# Patient Record
Sex: Female | Born: 1960 | Race: White | Hispanic: No | Marital: Married | Smoking: Former smoker
Health system: Southern US, Community
[De-identification: ages and names within clinical notes are randomized; demographics above are authoritative.]

## PROBLEM LIST (undated history)

## (undated) DIAGNOSIS — M8580 Other specified disorders of bone density and structure, unspecified site: Secondary | ICD-10-CM

## (undated) HISTORY — PX: ABDOMINAL HYSTERECTOMY: SHX81

## (undated) HISTORY — DX: Other specified disorders of bone density and structure, unspecified site: M85.80

---

## 2004-07-11 ENCOUNTER — Emergency Department: Payer: Self-pay | Admitting: Internal Medicine

## 2005-09-23 ENCOUNTER — Emergency Department: Payer: Self-pay | Admitting: Unknown Physician Specialty

## 2005-11-16 IMAGING — CT CT HEAD WITHOUT CONTRAST
2 series · 16 of 30 positions shown, 20 images · non-contrast
Comparison: none

REASON FOR EXAM: headache
COMMENTS:

PROCEDURE:     CT  - CT HEAD WITHOUT CONTRAST  - July 11, 2004  [DATE]
RESULT:     Unenhanced CT of the brain shows no evidence of acute
intracranial hemorrhage, midline shift or mass effect.  No skull fracture or
subgaleal hematoma.

[Series 2: without · axial · non-contrast · 0.42mm/px · z∈[-159,-44]mm · 13 of 27 slices shown, 17 images]
[im 2/27  brain]
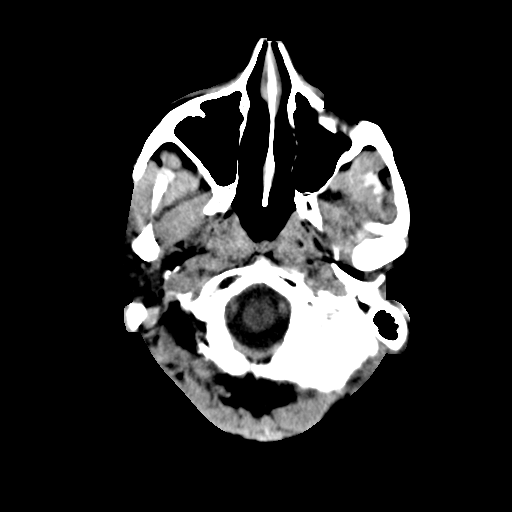
[im 2/27  bone]
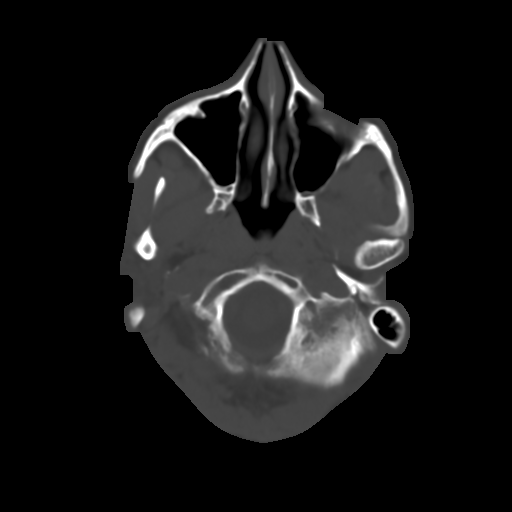
[im 4/27  brain]
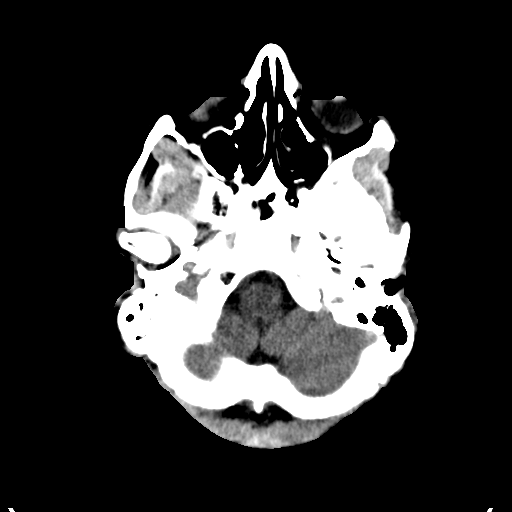
[im 6/27  brain]
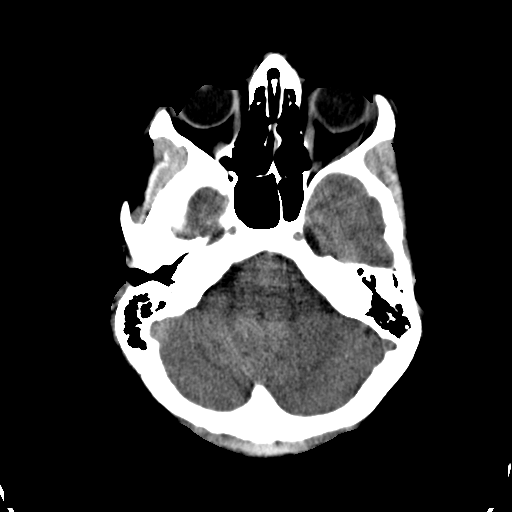
[im 8/27  brain]
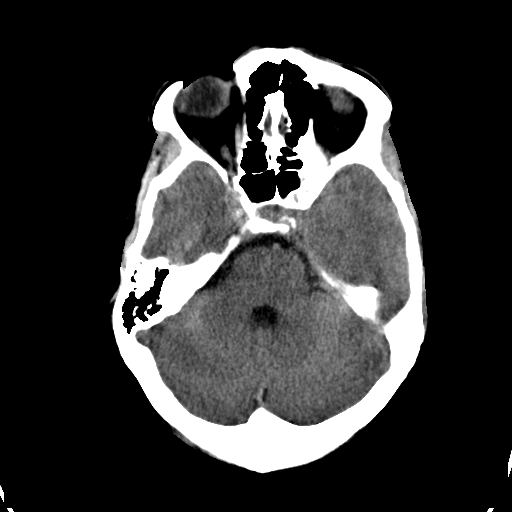
[im 10/27  brain]
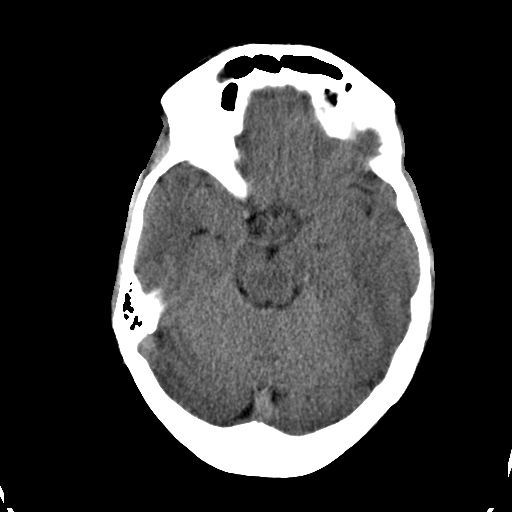
[im 10/27  bone]
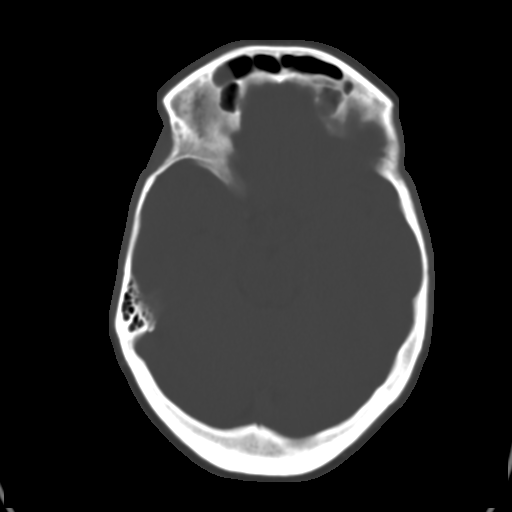
[im 12/27  brain]
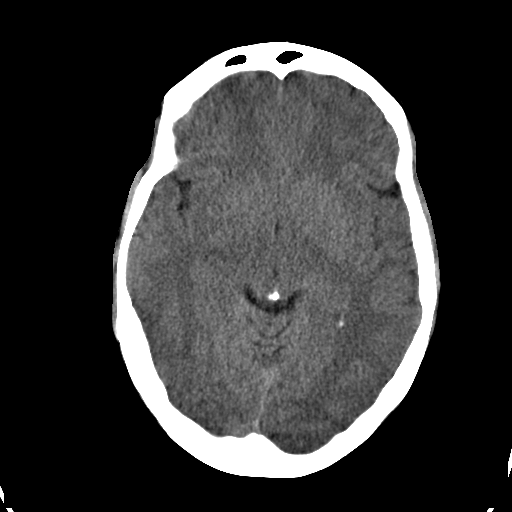
[im 14/27  brain]
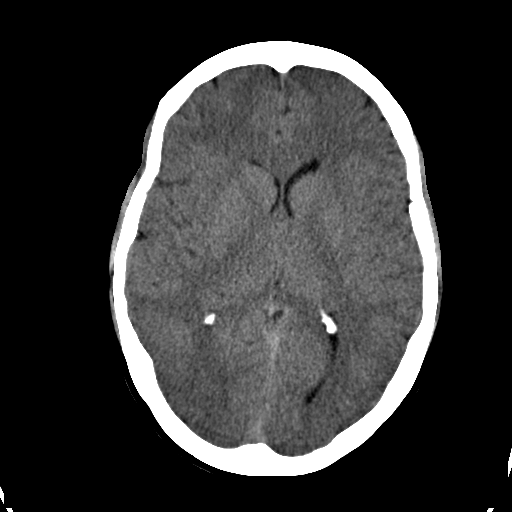
[im 15/27  brain]
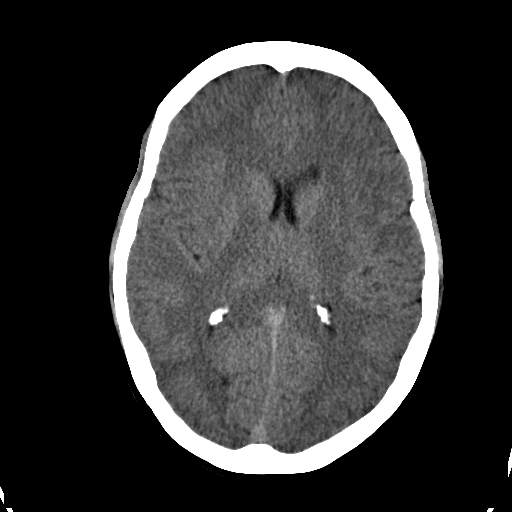
[im 17/27  brain]
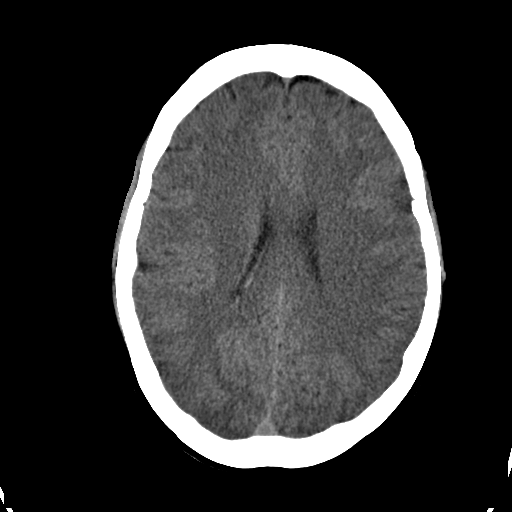
[im 17/27  bone]
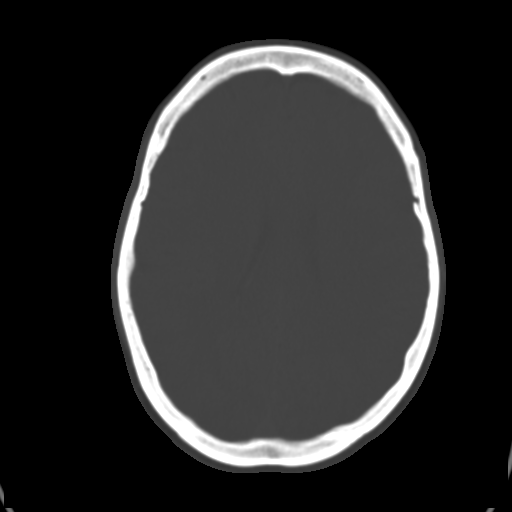
[im 19/27  brain]
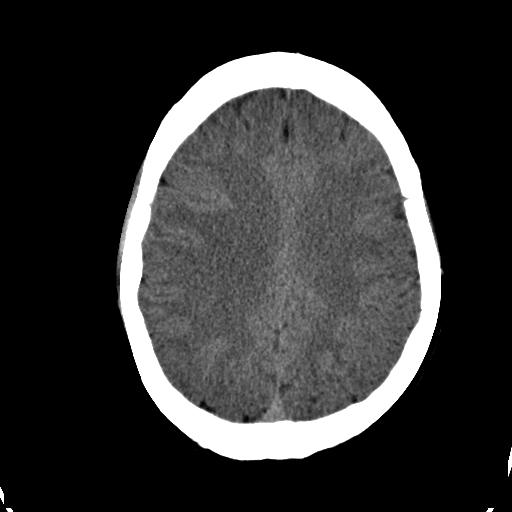
[im 21/27  brain]
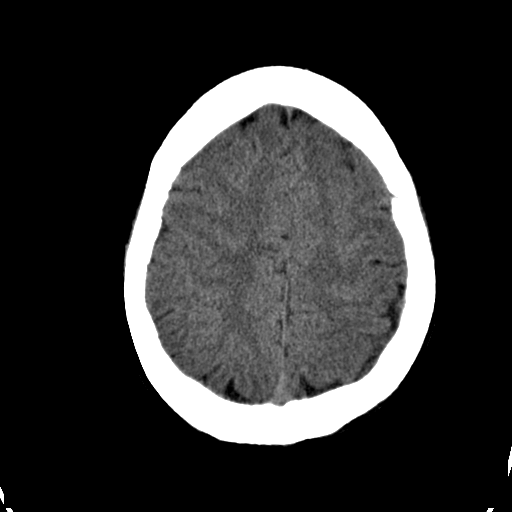
[im 23/27  brain]
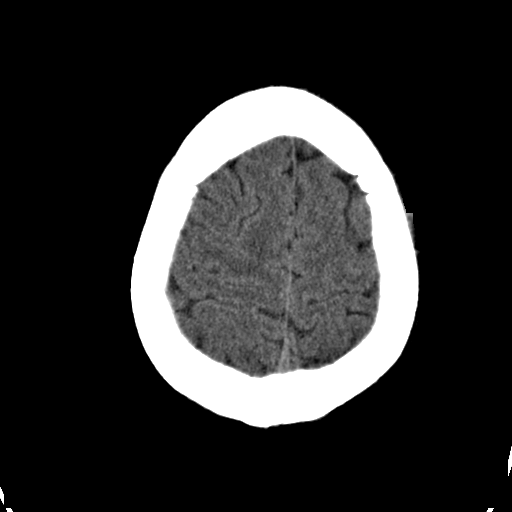
[im 25/27  brain]
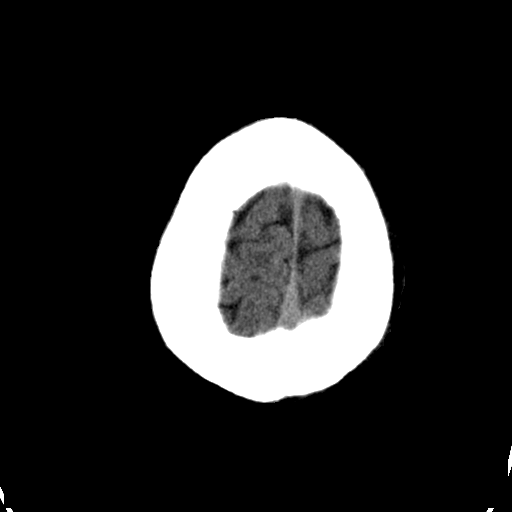
[im 25/27  bone]
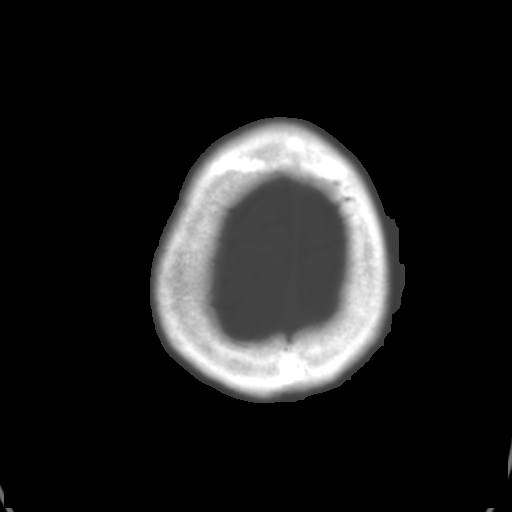

[Series 3: bone windows · axial · 0.42mm/px · z∈[-159,-119]mm · 3 of 27 slices shown]
[im 2/27  bone]
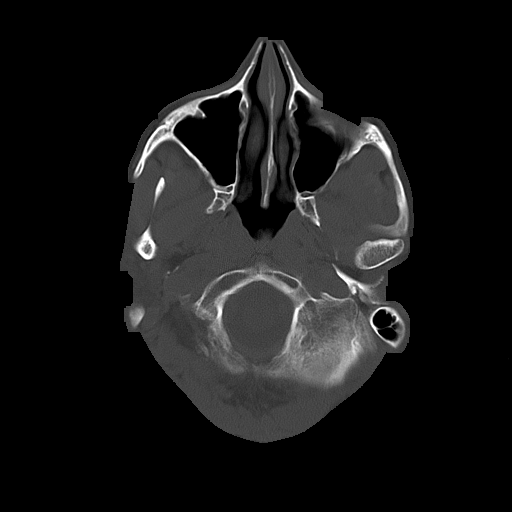
[im 6/27  bone]
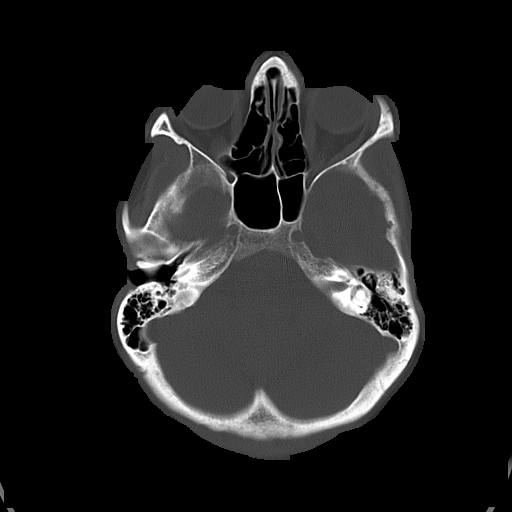
[im 10/27  bone]
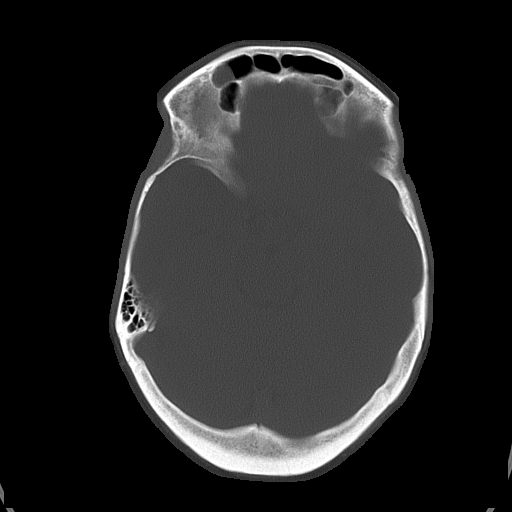

[16 of 30 positions shown; findings below may reference images not displayed]

IMPRESSION: 1.  Normal CT brain.

## 2010-01-04 ENCOUNTER — Emergency Department: Payer: Self-pay | Admitting: Emergency Medicine

## 2011-05-12 IMAGING — US ABDOMEN ULTRASOUND
1 series · 17 of 25 positions shown · non-contrast
Comparison: none

REASON FOR EXAM: ruq pain/ rlq pain
COMMENTS:

PROCEDURE:     US  - US ABDOMEN GENERAL SURVEY  - January 04, 2010  [DATE]
RESULT:     Comparison: None
TECHNIQUE: Multiple gray-scale and color-flow Doppler images of the abdomen
are presented for review.

[Series 1: abdomen ultrasound · 17 of 57 slices shown]
[im 1/57]
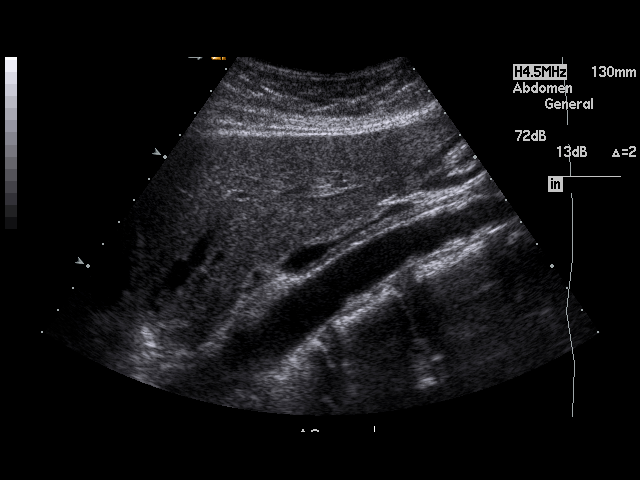
[im 5/57]
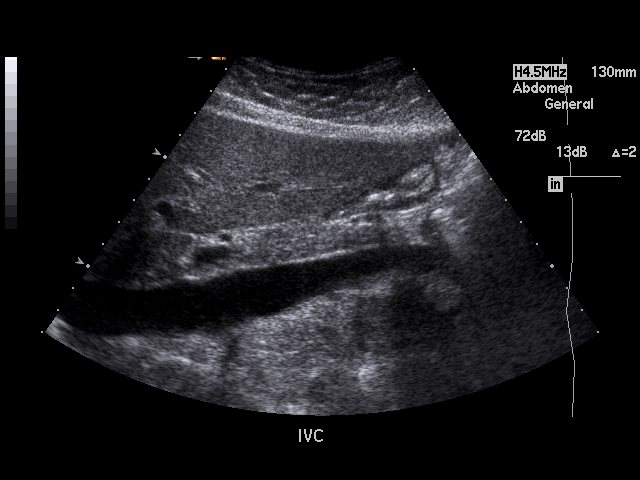
[im 8/57]
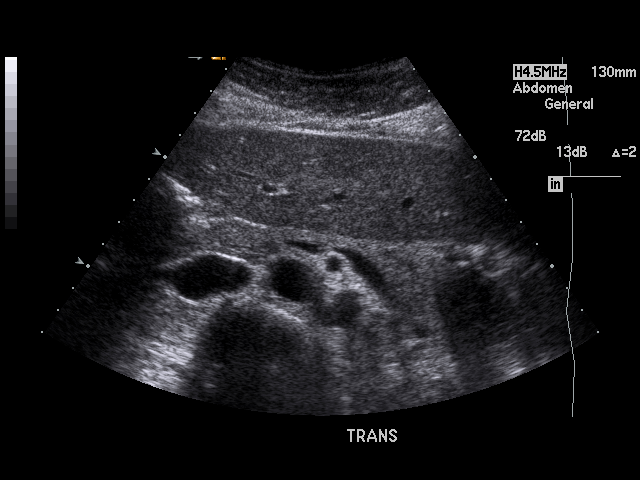
[im 12/57]
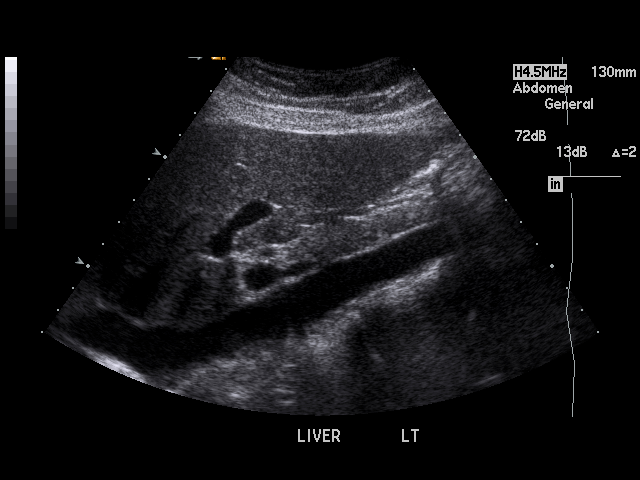
[im 15/57]
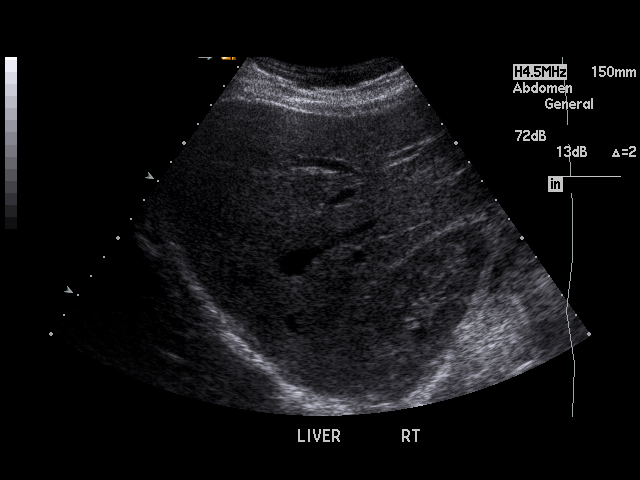
[im 19/57]
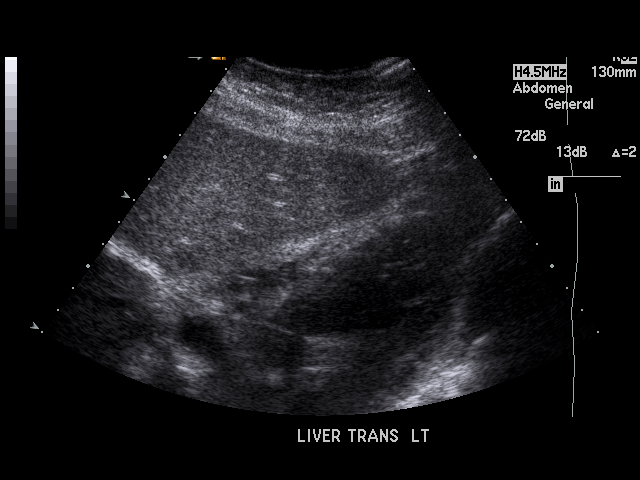
[im 22/57]
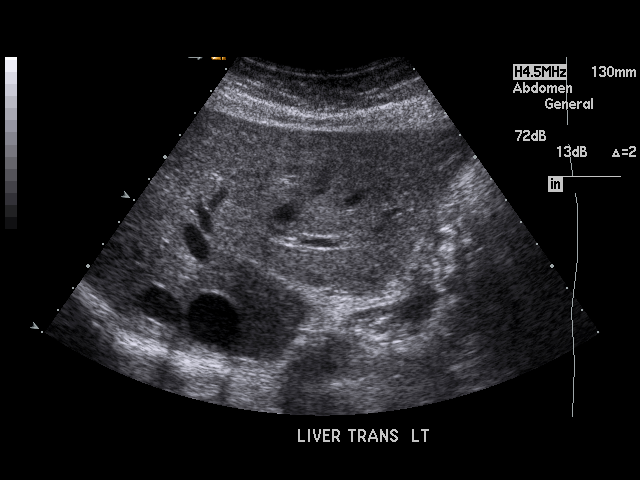
[im 26/57]
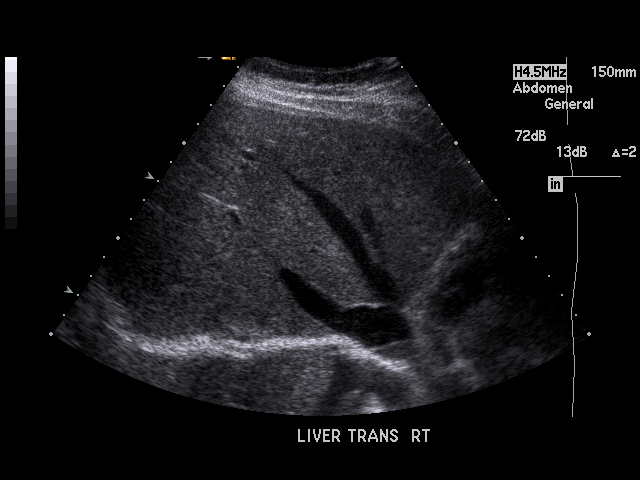
[im 29/57]
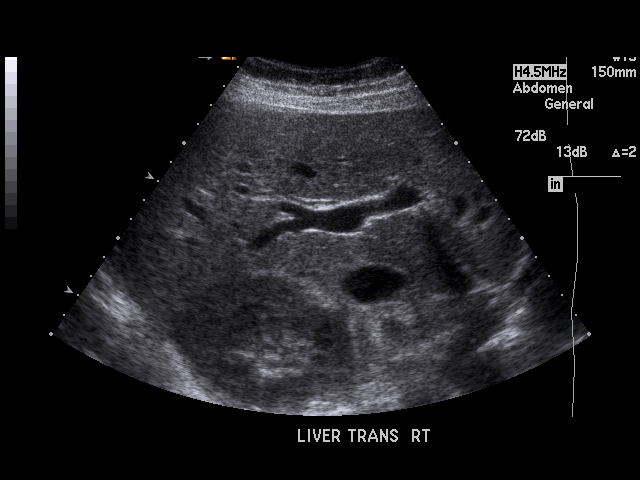
[im 31/57]
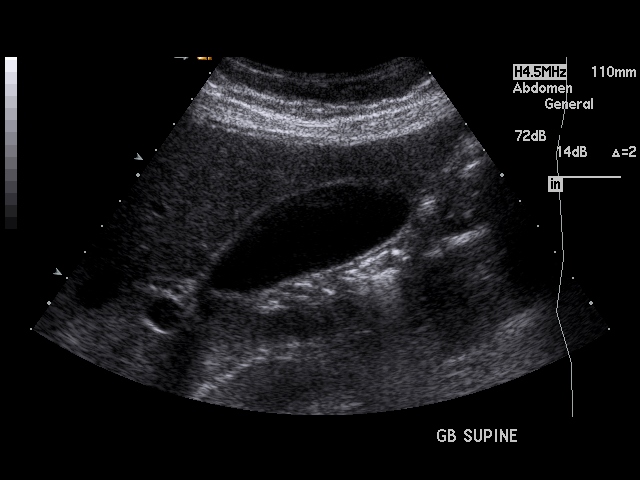
[im 36/57]
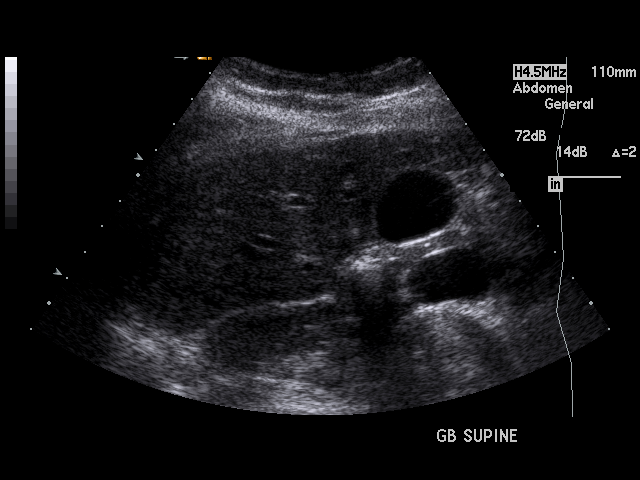
[im 38/57]
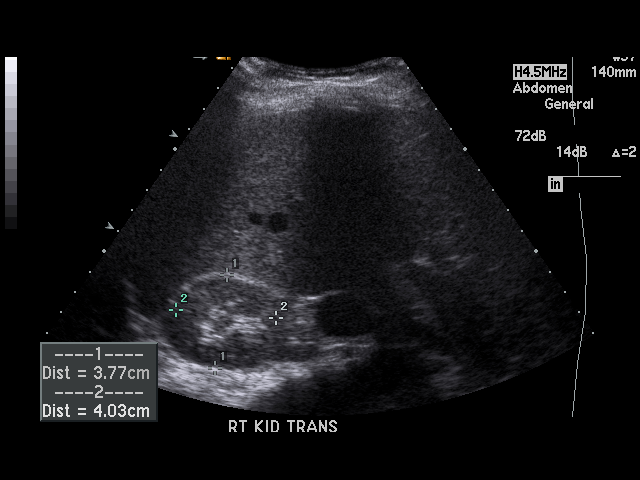
[im 43/57]
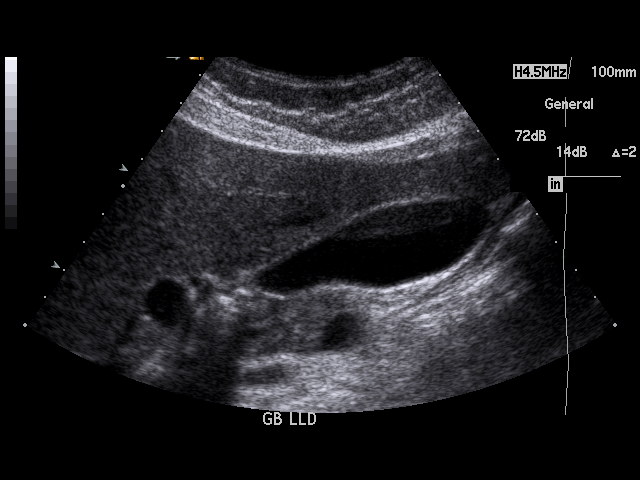
[im 45/57]
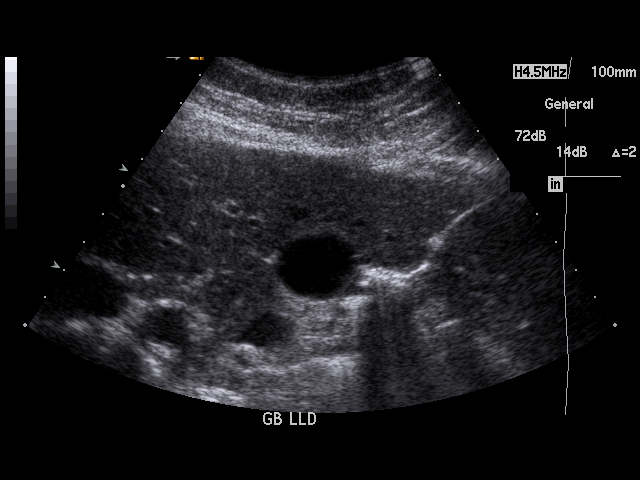
[im 50/57]
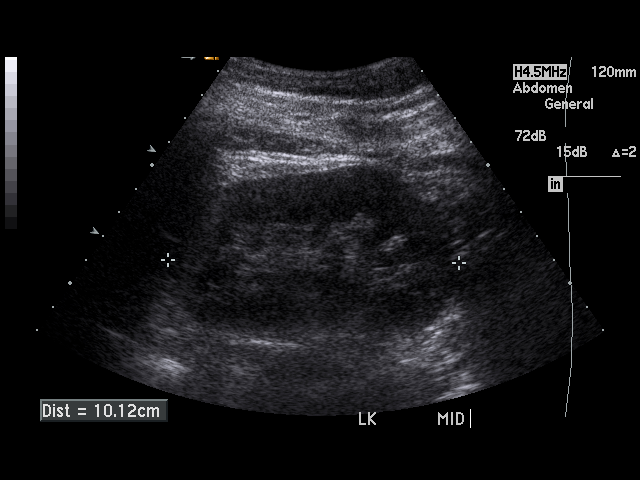
[im 52/57]
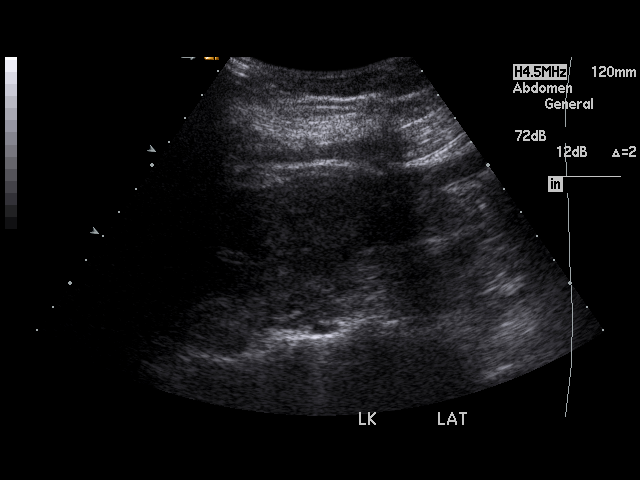
[im 57/57]
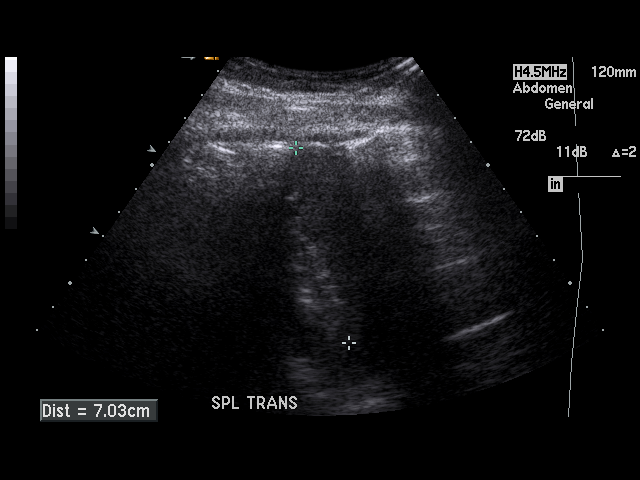

[17 of 25 positions shown; findings below may reference images not displayed]

FINDINGS: Visualized portions of the liver demonstrate normal echogenicity and normal
contours. The liver is without evidence of a focal hepatic lesion.

There is no cholelithiasis or biliary sludge. There is no intra- or
extrahepatic biliary ductal dilatation. The common duct measures 2.5 mm in
maximal diameter. There is no gallbladder wall thickening, pericholecystic
fluid, or sonographic Murphy's sign.

The visualized portion of the pancreas is normal in echogenicity. The spleen
is unremarkable. Bilateral kidneys are normal in echogenicity and size. The
right kidney measures 9.4 x 3.8 x 4 cm. The left kidney measures 10.1 x
x 4.7 cm. There are no renal calculi or hydronephrosis. The abdominal aorta
and IVC are unremarkable.
IMPRESSION: Normal abdominal ultrasound.

## 2014-07-01 ENCOUNTER — Emergency Department: Payer: Self-pay | Admitting: Emergency Medicine

## 2020-08-22 ENCOUNTER — Ambulatory Visit: Payer: Self-pay | Admitting: Family Medicine

## 2020-08-22 ENCOUNTER — Other Ambulatory Visit: Payer: Self-pay

## 2020-08-22 ENCOUNTER — Encounter: Payer: Self-pay | Admitting: Family Medicine

## 2020-08-22 VITALS — BP 127/62 | HR 72 | Temp 99.1°F | Resp 18 | Ht 63.5 in | Wt 116.4 lb

## 2020-08-22 DIAGNOSIS — Z889 Allergy status to unspecified drugs, medicaments and biological substances status: Secondary | ICD-10-CM

## 2020-08-22 DIAGNOSIS — Z8739 Personal history of other diseases of the musculoskeletal system and connective tissue: Secondary | ICD-10-CM

## 2020-08-22 DIAGNOSIS — Z8719 Personal history of other diseases of the digestive system: Secondary | ICD-10-CM

## 2020-08-22 DIAGNOSIS — Z7689 Persons encountering health services in other specified circumstances: Secondary | ICD-10-CM

## 2020-08-22 DIAGNOSIS — J329 Chronic sinusitis, unspecified: Secondary | ICD-10-CM

## 2020-08-22 MED ORDER — AMOXICILLIN-POT CLAVULANATE 875-125 MG PO TABS: 1.0000 | ORAL_TABLET | Freq: Two times a day (BID) | ORAL | 0 refills | Status: DC

## 2020-08-22 NOTE — Assessment & Plan Note (Signed)
Acute sinusitis with green purulent drainage reported from nose.  Will treat with augmentin 875-125mg  BID x 10 days.  Encouraged to restart claritin and mucinex, as she has taken these seasonally for allergies.  Offered and declined COVID testing in clinic today.  Plan: 1. Start augmentin 875-125mg  BID x 10 days 2. Restart claritin and mucinex, according to packaging directions 3. RTC PRN

## 2020-08-22 NOTE — Patient Instructions (Signed)
I have sent in a prescription for Augmentin 875-125mg  to take 1 tablet 2 x per day for the next 10 days.  Can restart on your claritin and mucinex to help with the secretions  We will plan to see you back in 6 months for your physical and labs  You will receive a survey after today's visit either digitally by e-mail or paper by [employer]. Your experiences and feedback matter to [place].  Please respond so we know how we are doing as we provide care for you.  Call us with any questions/concerns/needs.  It is my goal to be available to you for your health concerns.  Thanks for choosing me to be a partner in your healthcare needs!  Charlaine Dalton, FNP-C Family Nurse Practitioner [facility] Phone: [phone]

## 2020-08-22 NOTE — Assessment & Plan Note (Signed)
New patient establishment at [facility] for primary care.  Previously followed with [facility], copies of medical records to be requested.

## 2020-08-22 NOTE — Assessment & Plan Note (Signed)
History of seasonal allergies that are typically well managed with claritin and mucinex.

## 2020-08-22 NOTE — Assessment & Plan Note (Signed)
Manages with dietary modifications.  Reports in the past she had been on prilosec with good relief of symptoms.

## 2020-08-22 NOTE — Assessment & Plan Note (Signed)
Reports history of osteopenia, has been taking centrum silver with a supplementation of calcium through dietary sources.  Unknown last DEXA.  Awaiting previous medical records.

## 2020-08-22 NOTE — Progress Notes (Signed)
Subjective:    Patient ID: Gina Gill, female    DOB: 08-27-1961, 59 y.o.   MRN: [id number]  Gina Gill is a 59 y.o. female presenting on 08/22/2020 for Establish Care (Facial pressure, post nasal drainage x 4 days )   HPI   Gina Gill presents to clinic as a new patient to establish care for primary care services.  Previous PCP was at [facility].  Records will be requested.  Past medical, family, and surgical history reviewed w/ pt.  She has acute concerns for sinus pain/pressure, with green drainage and post nasal drainage x 4 days.  Denies known sick contacts, COVID testing, or vaccination for COVID.  Depression screen PHQ 2/9 08/22/2020  Decreased Interest 0  Down, Depressed, Hopeless 0  PHQ - 2 Score 0    Social History   Tobacco Use  . Smoking status: Current Every Day Smoker    Packs/day: 0.50    Years: 30.00    Pack years: 15.00  . Smokeless tobacco: Never Used  Vaping Use  . Vaping Use: Never used  Substance Use Topics  . Alcohol use: Not Currently  . Drug use: Never    Review of Systems  Constitutional: Negative.   HENT: Positive for congestion, postnasal drip, rhinorrhea, sinus pressure and sinus pain. Negative for dental problem, drooling, ear discharge, ear pain, facial swelling, hearing loss, mouth sores, nosebleeds, sneezing, sore throat, tinnitus, trouble swallowing and voice change.   Eyes: Negative.   Respiratory: Negative.   Cardiovascular: Negative.   Gastrointestinal: Negative.   Endocrine: Negative.   Genitourinary: Negative.   Musculoskeletal: Negative.   Skin: Negative.   Allergic/Immunologic: Negative.   Neurological: Negative.   Hematological: Negative.   Psychiatric/Behavioral: Negative.    Per HPI unless specifically indicated above     Objective:    BP 127/62 (BP Location: Right Arm, Patient Position: Sitting, Cuff Size: Normal)   Pulse 72   Temp 99.1 F (37.3 C) (Oral)   Resp 18   Ht 5' 3.5" (1.613 m)    Wt 116 lb 6.4 oz (52.8 kg)   SpO2 98%   BMI 20.30 kg/m   Wt Readings from Last 3 Encounters:  08/22/20 116 lb 6.4 oz (52.8 kg)    Physical Exam Vitals and nursing note reviewed.  Constitutional:      General: She is not in acute distress.    Appearance: Normal appearance. She is well-developed, well-groomed and normal weight. She is not ill-appearing or toxic-appearing.  HENT:     Head: Normocephalic and atraumatic.     Nose: Congestion and rhinorrhea present. Rhinorrhea is purulent.     Right Turbinates: Swollen. Not enlarged or pale.     Left Turbinates: Swollen. Not enlarged or pale.     Right Sinus: Maxillary sinus tenderness and frontal sinus tenderness present.     Left Sinus: Maxillary sinus tenderness and frontal sinus tenderness present.     Comments: Gina Gill is in place, covering mouth and nose. Eyes:     General: Lids are normal. Vision grossly intact.        Right eye: No discharge.        Left eye: No discharge.     Extraocular Movements: Extraocular movements intact.     Conjunctiva/sclera: Conjunctivae normal.     Pupils: Pupils are equal, round, and reactive to light.  Cardiovascular:     Rate and Rhythm: Normal rate and regular rhythm.     Pulses: Normal pulses.  Heart sounds: Normal heart sounds. No murmur heard. No friction rub. No gallop.   Pulmonary:     Effort: Pulmonary effort is normal. No respiratory distress.     Breath sounds: Normal breath sounds.  Skin:    General: Skin is warm and dry.     Capillary Refill: Capillary refill takes less than 2 seconds.  Neurological:     General: No focal deficit present.     Mental Status: She is alert and oriented to person, place, and time.  Psychiatric:        Attention and Perception: Attention and perception normal.        Mood and Affect: Mood and affect normal.        Speech: Speech normal.        Behavior: Behavior normal. Behavior is cooperative.        Thought Content: Thought content normal.         Cognition and Memory: Cognition and memory normal.        Judgment: Judgment normal.    No results found for this or any previous visit.    Assessment & Plan:   Problem List Items Addressed This Visit      Respiratory   Sinusitis    Acute sinusitis with green purulent drainage reported from nose.  Will treat with augmentin 875-125mg  BID x 10 days.  Encouraged to restart claritin and mucinex, as she has taken these seasonally for allergies.  Offered and declined COVID testing in clinic today.  Plan: 1. Start augmentin 875-125mg  BID x 10 days 2. Restart claritin and mucinex, according to packaging directions 3. RTC PRN      Relevant Medications   amoxicillin-clavulanate (AUGMENTIN) 875-125 MG tablet     Other   Encounter to establish care with new doctor - Primary    New patient establishment at [facility] for primary care.  Previously followed with [facility], copies of medical records to be requested.        History of gastroesophageal reflux (GERD)    Manages with dietary modifications.  Reports in the past she had been on prilosec with good relief of symptoms.      History of seasonal allergies    History of seasonal allergies that are typically well managed with claritin and mucinex.      History of osteopenia    Reports history of osteopenia, has been taking centrum silver with a supplementation of calcium through dietary sources.  Unknown last DEXA.  Awaiting previous medical records.         Meds ordered this encounter  Medications  . amoxicillin-clavulanate (AUGMENTIN) 875-125 MG tablet    Sig: Take 1 tablet by mouth 2 (two) times daily.    Dispense:  20 tablet    Refill:  0   Follow up plan: Return in about 6 months (around 02/20/2021) for CPE.   Charlaine Dalton, FNP Family Nurse Practitioner [facility] 08/22/2020, 11:28 AM

## 2021-06-11 ENCOUNTER — Encounter: Payer: Self-pay | Admitting: Internal Medicine

## 2021-06-11 ENCOUNTER — Other Ambulatory Visit: Payer: Self-pay

## 2021-06-11 ENCOUNTER — Ambulatory Visit: Payer: Self-pay | Admitting: Internal Medicine

## 2021-06-11 VITALS — BP 125/66 | HR 98 | Temp 97.8°F | Resp 18 | Ht 63.5 in | Wt 108.2 lb

## 2021-06-11 DIAGNOSIS — J329 Chronic sinusitis, unspecified: Secondary | ICD-10-CM

## 2021-06-11 DIAGNOSIS — B9789 Other viral agents as the cause of diseases classified elsewhere: Secondary | ICD-10-CM

## 2021-06-11 MED ORDER — AMOXICILLIN-POT CLAVULANATE 875-125 MG PO TABS: 1.0000 | ORAL_TABLET | Freq: Two times a day (BID) | ORAL | 0 refills | Status: DC

## 2021-06-11 NOTE — Progress Notes (Signed)
HPI  Pt reports facial pressure, watery eyes, runny nose, post nasal drip and cough. She reports this started 4-5 days ago. She is blowing clear mucous out of her nose. The cough is dry and nonproductive. She denies headache, eye pain, eye redness, discharge, ear pain, sore throat or shortness of breath. She denies fever, chills or body aches. She has tried Mucinex and Theraflu with minimal relief of symptoms. She has had a negative covid test at home.  Review of Systems     Past Medical History:  Diagnosis Date   Osteopenia     Family History  Problem Relation Age of Onset   Brain cancer Mother    Stroke Father    Heart disease Sister    Diabetes Sister    Heart disease Brother     Social History   Socioeconomic History   Marital status: Married    Spouse name: Not on file   Number of children: Not on file   Years of education: Not on file   Highest education level: Not on file  Occupational History   Not on file  Tobacco Use   Smoking status: Every Day    Packs/day: 0.50    Years: 30.00    Pack years: 15.00    Types: Cigarettes   Smokeless tobacco: Never  Vaping Use   Vaping Use: Never used  Substance and Sexual Activity   Alcohol use: Not Currently   Drug use: Never   Sexual activity: Not on file  Other Topics Concern   Not on file  Social History Narrative   Not on file   Social Determinants of Health   Financial Resource Strain: Not on file  Food Insecurity: Not on file  Transportation Needs: Not on file  Physical Activity: Not on file  Stress: Not on file  Social Connections: Not on file  Intimate Partner Violence: Not on file    No Known Allergies   Constitutional: Denies headache, fatigue, fever or abrupt weight changes.  HEENT:  Positive facial pressure, nasal congestion and post nasal drip. Denies eye redness, ear pain, ringing in the ears, wax buildup, nasal congestion or sore throat. Respiratory: Positive cough. Denies difficulty breathing  or shortness of breath.  Cardiovascular: Denies chest pain, chest tightness, palpitations or swelling in the hands or feet.   No other specific complaints in a complete review of systems (except as listed in HPI above).  Objective:  BP 125/66 (BP Location: Right Arm, Patient Position: Sitting, Cuff Size: Small)   Pulse 98   Temp 97.8 F (36.6 C) (Temporal)   Resp 18   Ht 5' 3.5" (1.613 m)   Wt 108 lb 3.2 oz (49.1 kg)   SpO2 98%   BMI 18.87 kg/m    General: Appears her stated age, well developed, well nourished in NAD. HEENT: Head: normal shape and size, maxillary sinus tenderness noted;  Nose: mucosa boggy and moist, septum midline; Throat/Mouth: + PND. Teeth present, mucosa erythematous and moist, no exudate noted, no lesions or ulcerations noted.  Neck:  No adenopathy noted.  Cardiovascular: Normal rate and rhythm. S1,S2 noted.  No murmur, rubs or gallops noted.  Pulmonary/Chest: Normal effort and positive vesicular breath sounds. No respiratory distress. No wheezes, rales or ronchi noted.       Assessment & Plan:  Viral Sinusitis  Can use a Neti Pot which can be purchased from your local drug store. Flonase 2 sprays each nostril for 3 days and then as needed. Start Xyzal  OTC She is insistent on abx although I think Prednisone would be more beneficial, eRx for Augmentin BID for 10 days  RTC as needed or if symptoms persist. Nicki Reaper, NP This visit occurred during the SARS-CoV-2 public health emergency.  Safety protocols were in place, including screening questions prior to the visit, additional usage of staff PPE, and extensive cleaning of exam room while observing appropriate contact time as indicated for disinfecting solutions.

## 2021-06-11 NOTE — Patient Instructions (Signed)

## 2021-10-12 ENCOUNTER — Ambulatory Visit: Payer: Self-pay | Admitting: Internal Medicine

## 2021-10-12 ENCOUNTER — Encounter: Payer: Self-pay | Admitting: Internal Medicine

## 2021-10-12 ENCOUNTER — Other Ambulatory Visit: Payer: Self-pay

## 2021-10-12 VITALS — BP 124/66 | HR 79 | Temp 97.5°F | Wt 107.0 lb

## 2021-10-12 DIAGNOSIS — J0120 Acute ethmoidal sinusitis, unspecified: Secondary | ICD-10-CM

## 2021-10-12 DIAGNOSIS — F411 Generalized anxiety disorder: Secondary | ICD-10-CM

## 2021-10-12 MED ORDER — BUPROPION HCL ER (XL) 150 MG PO TB24: 150.0000 mg | ORAL_TABLET | Freq: Every day | ORAL | 2 refills | Status: DC

## 2021-10-12 MED ORDER — AMOXICILLIN 875 MG PO TABS: 875.0000 mg | ORAL_TABLET | Freq: Two times a day (BID) | ORAL | 0 refills | Status: AC

## 2021-10-12 NOTE — Assessment & Plan Note (Signed)
Will restart Wellbutrin 150 mg XL daily Support offered

## 2021-10-12 NOTE — Progress Notes (Signed)
HPI  Pt presents to the clinic today with c/o headache, facial pain and pressure, nasal congestion, sore throat and cough. She reports this started 10 days ago. The headache is located in his forehead. She describes the pain as pressure. She is not blowing any mucous out of his nose. She denies difficulty swallowing. The cough is protective of green mucous. She denies runny nose, ear pain, shortness of breath, fever, chills or body aches. She haas tried Mucinex OTC with minimal relief of symptoms. She does not take an antihistamine or nasal steroid consistently. She is prone to sinus infections and reports this feels the same. She does smoke.  She also reports recurrent anxiety. This has been a long standing issue that has progressively getting worse. She is worrying about different things. She has not seen a therapist. She has tried Wellbutrin in the past. She denies depression, SI/HI.  Review of Systems     Past Medical History:  Diagnosis Date   Osteopenia     Family History  Problem Relation Age of Onset   Brain cancer Mother    Stroke Father    Heart disease Sister    Diabetes Sister    Heart disease Brother     Social History   Socioeconomic History   Marital status: Married    Spouse name: Not on file   Number of children: Not on file   Years of education: Not on file   Highest education level: Not on file  Occupational History   Not on file  Tobacco Use   Smoking status: Every Day    Packs/day: 0.50    Years: 30.00    Pack years: 15.00    Types: Cigarettes   Smokeless tobacco: Never  Vaping Use   Vaping Use: Never used  Substance and Sexual Activity   Alcohol use: Not Currently   Drug use: Never   Sexual activity: Not on file  Other Topics Concern   Not on file  Social History Narrative   Not on file   Social Determinants of Health   Financial Resource Strain: Not on file  Food Insecurity: Not on file  Transportation Needs: Not on file  Physical  Activity: Not on file  Stress: Not on file  Social Connections: Not on file  Intimate Partner Violence: Not on file    No Known Allergies   Constitutional: Positive headache. Denies fatigue, fever or abrupt weight changes.  HEENT:  Positive facial pain, nasal congestion and sore throat. Denies eye redness, ear pain, ringing in the ears, wax buildup, runny nose or bloody nose. Respiratory: Positive cough. Denies difficulty breathing or shortness of breath.  Cardiovascular: Denies chest pain, chest tightness, palpitations or swelling in the hands or feet.  Psych: Pt reports anxiety. Denies  depression, SI/HI.  No other specific complaints in a complete review of systems (except as listed in HPI above).  Objective:   BP 124/66 (BP Location: Left Arm, Patient Position: Sitting, Cuff Size: Normal)    Pulse 79    Temp (!) 97.5 F (36.4 C) (Temporal)    Wt 107 lb (48.5 kg)    SpO2 97%    BMI 18.66 kg/m    General: Appears her stated age,  in NAD. HEENT: Head: normal shape and size, ethmoidal sinus tenderness noted;  Neck:  No adenopathy noted.  Cardiovascular: Normal rate and rhythm. S1,S2 noted.  No murmur, rubs or gallops noted.  Pulmonary/Chest: Normal effort and positive vesicular breath sounds. No respiratory distress. No  wheezes, rales or ronchi noted.  Neuro: Alert and oriented. Psych: Mood and affect normal. Mildly anxious appearing. Judgement and thought content normal.       Assessment & Plan:   Acute Ethmoidal Sinusitis  Can use a Neti Pot which can be purchased from your local drug store. Flonase 2 sprays each nostril for 3 days and then as needed. Start daily antihistamine OTC eRx for Amoxil 875 mg BID for 10 days  RTC as needed or if symptoms persist. Webb Silversmith, NP This visit occurred during the SARS-CoV-2 public health emergency.  Safety protocols were in place, including screening questions prior to the visit, additional usage of staff PPE, and extensive  cleaning of exam room while observing appropriate contact time as indicated for disinfecting solutions.

## 2021-10-12 NOTE — Patient Instructions (Signed)

## 2021-12-08 ENCOUNTER — Other Ambulatory Visit: Payer: Self-pay | Admitting: Internal Medicine

## 2021-12-09 NOTE — Telephone Encounter (Signed)
Requested Prescriptions  ?Pending Prescriptions Disp Refills  ?? buPROPion (WELLBUTRIN XL) 150 MG 24 hr tablet [Pharmacy Med Name: BUPROPION HCL XL 150 MG TABLET] 90 tablet 1  ?  Sig: TAKE 1 TABLET BY MOUTH EVERY DAY  ?  ? Psychiatry: Antidepressants - bupropion Failed - 12/08/2021  1:36 PM  ?  ?  Failed - Cr in normal range and within 360 days  ?  No results found for: CREATININE, LABCREAU, LABCREA, POCCRE   ?  ?  Failed - AST in normal range and within 360 days  ?  No results found for: POCAST, AST   ?  ?  Failed - ALT in normal range and within 360 days  ?  No results found for: ALT, LABALT, POCALT   ?  ?  Passed - Last BP in normal range  ?  BP Readings from Last 1 Encounters:  ?10/12/21 124/66  ?   ?  ?  Passed - Valid encounter within last 6 months  ?  Recent Outpatient Visits   ?      ? 1 month ago Acute non-recurrent ethmoidal sinusitis  ? [facility] [place], [place] W, NP  ? 6 months ago Viral sinusitis  ? [facility] [place], Salvadore Oxford, NP  ? 1 year ago Encounter to establish care with new doctor  ? [facility], Jodelle Gross, FNP  ?  ?  ? ?  ?  ?  ? ? ?

## 2022-01-18 ENCOUNTER — Ambulatory Visit: Payer: Self-pay

## 2022-01-18 NOTE — Telephone Encounter (Signed)
I can work her in tomorrow if needed at 11:20 ?

## 2022-01-18 NOTE — Telephone Encounter (Signed)
?  Chief Complaint: Blood in Urine - back pressure. ?Symptoms: Blood in urine back pressure, burning with urination ?Frequency: since yesterday ?Pertinent Negatives: Patient denies Fever ?Disposition: [] ED /[x] Urgent Care (no appt availability in office) / [] Appointment(In office/virtual)/ []  [facility]/ [] Home Care/ [] Refused Recommended Disposition /[] [place] [facility]/ []  Follow-up with PCP ?Additional Notes: PT refuses UC and will wait for appt on Weds. Pt advised that UC and ED are available if desired.  ?Pt has had [facility] in the past.  ? ?Reason for Disposition ? Side (flank) or lower back pain present ? ?Answer Assessment - Initial Assessment Questions ?1. SYMPTOM: "What's the main symptom you're concerned about?" (e.g., frequency, incontinence) ?    Blood in urine - since this morning ?2. ONSET: "When did the  Burning  start?" ?    Yesterday evening ?3. PAIN: "Is there any pain?" If Yes, ask: "How bad is it?" (Scale: 1-10; mild, moderate, severe) ?    yes ?4. CAUSE: "What do you think is causing the symptoms?" ?    UTI ?5. OTHER SYMPTOMS: "Do you have any other symptoms?" (e.g., fever, flank pain, blood in urine, pain with urination) ?    Blood in urine - pain/pressure in back ?6. PREGNANCY: "Is there any chance you are pregnant?" "When was your last menstrual period?" ?    Na ? ?Protocols used: Urinary Symptoms-A-AH ? ?

## 2022-01-20 ENCOUNTER — Ambulatory Visit: Payer: Self-pay | Admitting: Internal Medicine

## 2022-01-28 ENCOUNTER — Ambulatory Visit: Payer: Self-pay | Admitting: Internal Medicine

## 2022-01-28 ENCOUNTER — Encounter: Payer: Self-pay | Admitting: Internal Medicine

## 2022-01-28 DIAGNOSIS — R319 Hematuria, unspecified: Secondary | ICD-10-CM

## 2022-01-28 DIAGNOSIS — N390 Urinary tract infection, site not specified: Secondary | ICD-10-CM

## 2022-01-28 LAB — POCT URINALYSIS DIPSTICK
Bilirubin, UA: NEGATIVE
Blood, UA: NEGATIVE
Glucose, UA: NEGATIVE
Ketones, UA: NEGATIVE
Leukocytes, UA: NEGATIVE
Nitrite, UA: NEGATIVE
Protein, UA: NEGATIVE
Spec Grav, UA: 1.015 (ref 1.010–1.025)
Urobilinogen, UA: 0.2 E.U./dL
pH, UA: 7.5 (ref 5.0–8.0)

## 2022-01-28 MED ORDER — BUPROPION HCL ER (XL) 150 MG PO TB24: 150.0000 mg | ORAL_TABLET | Freq: Every day | ORAL | 1 refills | Status: DC

## 2022-01-28 NOTE — Progress Notes (Signed)
Subjective:    Patient ID: Gina Gill, female    DOB: April 24, 1961, 61 y.o.   MRN: [id number]  HPI  Patient presents to clinic today for urgent care follow-up.  She presented to the urgent care with complaint of UTI symptoms.  She was treated for a UTI with Macrobid x5 days.  She reports she seems to have a little pressure but overall her symptoms have resolved.  She reports urgent care wanted her to follow-up because of the amount of protein that was in her urine, > 300.  She would like a refill of her Wellbutrin today as well.  Review of Systems  Past Medical History:  Diagnosis Date   Osteopenia     Current Outpatient Medications  Medication Sig Dispense Refill   buPROPion (WELLBUTRIN XL) 150 MG 24 hr tablet TAKE 1 TABLET BY MOUTH EVERY DAY 90 tablet 0   meclizine (ANTIVERT) 12.5 MG tablet Take 12.5 mg by mouth 3 (three) times daily as needed for dizziness.     Multiple Vitamin (MULTIVITAMIN) tablet Take 1 tablet by mouth daily.     No current facility-administered medications for this visit.    No Known Allergies  Family History  Problem Relation Age of Onset   Brain cancer Mother    Stroke Father    Heart disease Sister    Diabetes Sister    Heart disease Brother     Social History   Socioeconomic History   Marital status: Married    Spouse name: Not on file   Number of children: Not on file   Years of education: Not on file   Highest education level: Not on file  Occupational History   Not on file  Tobacco Use   Smoking status: Every Day    Packs/day: 0.50    Years: 30.00    Pack years: 15.00    Types: Cigarettes   Smokeless tobacco: Never  Vaping Use   Vaping Use: Never used  Substance and Sexual Activity   Alcohol use: Not Currently   Drug use: Never   Sexual activity: Not on file  Other Topics Concern   Not on file  Social History Narrative   Not on file   Social Determinants of Health   Financial Resource Strain: Not on file  Food  Insecurity: Not on file  Transportation Needs: Not on file  Physical Activity: Not on file  Stress: Not on file  Social Connections: Not on file  Intimate Partner Violence: Not on file     Constitutional: Denies fever, malaise, fatigue, headache or abrupt weight changes.  Respiratory: Denies difficulty breathing, shortness of breath, cough or sputum production.   Cardiovascular: Denies chest pain, chest tightness, palpitations or swelling in the hands or feet.  Gastrointestinal: Denies abdominal pain, bloating, constipation, diarrhea or blood in the stool.  GU: Denies urgency, frequency, pain with urination, burning sensation, blood in urine, odor or discharge.  No other specific complaints in a complete review of systems (except as listed in HPI above).     Objective:   Physical Exam BP 124/82   Pulse 70   Wt 123 lb 9.6 oz (56.1 kg)   SpO2 97%   BMI 21.55 kg/m   Wt Readings from Last 3 Encounters:  10/12/21 107 lb (48.5 kg)  06/11/21 108 lb 3.2 oz (49.1 kg)  08/22/20 116 lb 6.4 oz (52.8 kg)    General: Appears her stated age, well developed, well nourished in NAD. Cardiovascular: Normal rate and  rhythm. S1,S2 noted.  No murmur, rubs or gallops noted.  Pulmonary/Chest: Normal effort and positive vesicular breath sounds. No respiratory distress. No wheezes, rales or ronchi noted.  Abdomen: Soft and nontender. Normal bowel sounds.  Musculoskeletal: No difficulty with gait.  Neurological: Alert and oriented.       Assessment & Plan:   UTI, Proteinuria:  Urgent care notes and labs reviewed Urinalysis: Normal, no evidence of protein in the urine Symptoms have resolved, no need for additional antibiotics at the time  RTC in 4 months for follow-up of chronic conditions  Nicki Reaper, NP

## 2022-01-28 NOTE — Patient Instructions (Signed)
Urinary Tract Infection, Adult A urinary tract infection (UTI) is an infection of any part of the urinary tract. The urinary tract includes: The kidneys. The ureters. The bladder. The urethra. These organs make, store, and get rid of pee (urine) in the body. What are the causes? This infection is caused by germs (bacteria) in your genital area. These germs grow and cause swelling (inflammation) of your urinary tract. What increases the risk? The following factors may make you more likely to develop this condition: Using a small, thin tube (catheter) to drain pee. Not being able to control when you pee or poop (incontinence). Being female. If you are female, these things can increase the risk: Using these methods to prevent pregnancy: A medicine that kills sperm (spermicide). A device that blocks sperm (diaphragm). Having low levels of a female hormone (estrogen). Being pregnant. You are more likely to develop this condition if: You have genes that add to your risk. You are sexually active. You take antibiotic medicines. You have trouble peeing because of: A prostate that is bigger than normal, if you are female. A blockage in the part of your body that drains pee from the bladder. A kidney stone. A nerve condition that affects your bladder. Not getting enough to drink. Not peeing often enough. You have other conditions, such as: Diabetes. A weak disease-fighting system (immune system). Sickle cell disease. Gout. Injury of the spine. What are the signs or symptoms? Symptoms of this condition include: Needing to pee right away. Peeing small amounts often. Pain or burning when peeing. Blood in the pee. Pee that smells bad or not like normal. Trouble peeing. Pee that is cloudy. Fluid coming from the vagina, if you are female. Pain in the belly or lower back. Other symptoms include: Vomiting. Not feeling hungry. Feeling mixed up (confused). This may be the first symptom in  older adults. Being tired and grouchy (irritable). A fever. Watery poop (diarrhea). How is this treated? Taking antibiotic medicine. Taking other medicines. Drinking enough water. In some cases, you may need to see a specialist. Follow these instructions at home:  Medicines Take over-the-counter and prescription medicines only as told by your doctor. If you were prescribed an antibiotic medicine, take it as told by your doctor. Do not stop taking it even if you start to feel better. General instructions Make sure you: Pee until your bladder is empty. Do not hold pee for a long time. Empty your bladder after sex. Wipe from front to back after peeing or pooping if you are a female. Use each tissue one time when you wipe. Drink enough fluid to keep your pee pale yellow. Keep all follow-up visits. Contact a doctor if: You do not get better after 1-2 days. Your symptoms go away and then come back. Get help right away if: You have very bad back pain. You have very bad pain in your lower belly. You have a fever. You have chills. You feeling like you will vomit or you vomit. Summary A urinary tract infection (UTI) is an infection of any part of the urinary tract. This condition is caused by germs in your genital area. There are many risk factors for a UTI. Treatment includes antibiotic medicines. Drink enough fluid to keep your pee pale yellow. This information is not intended to replace advice given to you by your health care provider. Make sure you discuss any questions you have with your health care provider. Document Revised: 04/11/2020 Document Reviewed: 04/11/2020 Elsevier Patient Education    2023 [employer] Inc[employer]  

## 2022-03-18 ENCOUNTER — Encounter: Payer: Self-pay | Admitting: Internal Medicine

## 2022-03-18 ENCOUNTER — Ambulatory Visit: Payer: Self-pay | Admitting: Internal Medicine

## 2022-03-18 VITALS — BP 120/74 | HR 69 | Temp 96.2°F | Wt 128.0 lb

## 2022-03-18 DIAGNOSIS — R609 Edema, unspecified: Secondary | ICD-10-CM

## 2022-03-18 MED ORDER — MECLIZINE HCL 12.5 MG PO TABS: 12.5000 mg | ORAL_TABLET | Freq: Three times a day (TID) | ORAL | 0 refills | Status: DC | PRN

## 2022-03-18 NOTE — Patient Instructions (Signed)
Peripheral Edema  Peripheral edema is swelling that is caused by a buildup of fluid. Peripheral edema most often affects the lower legs, ankles, and feet. It can also develop in the arms, hands, and face. The area of the body that has peripheral edema will look swollen. It may also feel heavy or warm. Your clothes may start to feel tight. Pressing on the area may make a temporary dent in your skin (pitting edema). You may not be able to move your swollen arm or leg as much as usual. There are many causes of peripheral edema. It can happen because of a complication of other conditions such as heart failure, kidney disease, or a problem with your circulation. It also can be a side effect of certain medicines or happen because of an infection. It often happens to women during pregnancy. Sometimes, the cause is not known. Follow these instructions at home: Managing pain, stiffness, and swelling  Raise (elevate) your legs while you are sitting or lying down. Move around often to prevent stiffness and to reduce swelling. Do not sit or stand for long periods of time. Do not wear tight clothing. Do not wear garters on your upper legs. Exercise your legs to get your circulation going. This helps to move the fluid back into your blood vessels, and it may help the swelling go down. Wear compression stockings as told by your health care provider. These stockings help to prevent blood clots and reduce swelling in your legs. It is important that these are the correct size. These stockings should be prescribed by your doctor to prevent possible injuries. If elastic bandages or wraps are recommended, use them as told by your health care provider. Medicines Take over-the-counter and prescription medicines only as told by your health care provider. Your health care provider may prescribe medicine to help your body get rid of excess water (diuretic). Take this medicine if you are told to take it. General  instructions Eat a low-salt (low-sodium) diet as told by your health care provider. Sometimes, eating less salt may reduce swelling. Pay attention to any changes in your symptoms. Moisturize your skin daily to help prevent skin from cracking and draining. Keep all follow-up visits. This is important. Contact a health care provider if: You have a fever. You have swelling in only one leg. You have increased swelling, redness, or pain in one or both of your legs. You have drainage or sores at the area where you have edema. Get help right away if: You have edema that starts suddenly or is getting worse, especially if you are pregnant or have a medical condition. You develop shortness of breath, especially when you are lying down. You have pain in your chest or abdomen. You feel weak. You feel like you will faint. These symptoms may be an emergency. Get help right away. Call 911. Do not wait to see if the symptoms will go away. Do not drive yourself to the hospital. Summary Peripheral edema is swelling that is caused by a buildup of fluid. Peripheral edema most often affects the lower legs, ankles, and feet. Move around often to prevent stiffness and to reduce swelling. Do not sit or stand for long periods of time. Pay attention to any changes in your symptoms. Contact a health care provider if you have edema that starts suddenly or is getting worse, especially if you are pregnant or have a medical condition. Get help right away if you develop shortness of breath, especially when lying down.   This information is not intended to replace advice given to you by your health care provider. Make sure you discuss any questions you have with your health care provider. Document Revised: 05/04/2021 Document Reviewed: 05/04/2021 Elsevier Patient Education  2023 [employer] Inc[employer]  

## 2022-03-18 NOTE — Progress Notes (Signed)
Subjective:    Patient ID: Gina Gill, female    DOB: 1960-10-22, 61 y.o.   MRN: [id number]  HPI  Patient presents to clinic today with complaint of swelling in her legs.  She noticed this 2 months ago.  It has occurred twice since that time.  She reports her legs started to swell 2 days ago and seem to have resolved this morning.  She reports the swelling is uncomfortable but she denies any redness or warmth.  She denies any chest pain or difficulty breathing.  She has no history of heart failure.  She does not add salt to her diet.  She was not sitting or standing for an excessive period of time.  Review of Systems     Past Medical History:  Diagnosis Date   Osteopenia     Current Outpatient Medications  Medication Sig Dispense Refill   buPROPion (WELLBUTRIN XL) 150 MG 24 hr tablet Take 1 tablet (150 mg total) by mouth daily. 90 tablet 1   meclizine (ANTIVERT) 12.5 MG tablet Take 12.5 mg by mouth 3 (three) times daily as needed for dizziness.     Multiple Vitamin (MULTIVITAMIN) tablet Take 1 tablet by mouth daily.     No current facility-administered medications for this visit.    No Known Allergies  Family History  Problem Relation Age of Onset   Brain cancer Mother    Stroke Father    Heart disease Sister    Diabetes Sister    Heart disease Brother     Social History   Socioeconomic History   Marital status: Married    Spouse name: Not on file   Number of children: Not on file   Years of education: Not on file   Highest education level: Not on file  Occupational History   Not on file  Tobacco Use   Smoking status: Every Day    Packs/day: 0.50    Years: 30.00    Total pack years: 15.00    Types: Cigarettes   Smokeless tobacco: Never  Vaping Use   Vaping Use: Never used  Substance and Sexual Activity   Alcohol use: Not Currently   Drug use: Never   Sexual activity: Not on file  Other Topics Concern   Not on file  Social History Narrative   Not  on file   Social Determinants of Health   Financial Resource Strain: Not on file  Food Insecurity: Not on file  Transportation Needs: Not on file  Physical Activity: Not on file  Stress: Not on file  Social Connections: Not on file  Intimate Partner Violence: Not on file     Constitutional: Denies fever, malaise, fatigue, headache or abrupt weight changes.  HEENT: Denies eye pain, eye redness, ear pain, ringing in the ears, wax buildup, runny nose, nasal congestion, bloody nose, or sore throat. Respiratory: Denies difficulty breathing, shortness of breath, cough or sputum production.   Cardiovascular: Patient reports swelling in legs.  Denies chest pain, chest tightness, palpitations or swelling in the hands.  Gastrointestinal: Denies abdominal pain, bloating, constipation, diarrhea or blood in the stool.  GU: Denies urgency, frequency, pain with urination, burning sensation, blood in urine, odor or discharge. Musculoskeletal: Denies decrease in range of motion, difficulty with gait, muscle pain or joint pain and swelling.  Skin: Denies redness, rashes, lesions or ulcercations.    No other specific complaints in a complete review of systems (except as listed in HPI above).  Objective:   Physical  Exam   BP 120/74 (BP Location: Left Arm, Patient Position: Sitting, Cuff Size: Normal)   Pulse 69   Temp (!) 96.2 F (35.7 C) (Temporal)   Wt 128 lb (58.1 kg)   SpO2 98%   BMI 22.32 kg/m   Wt Readings from Last 3 Encounters:  01/28/22 123 lb 9.6 oz (56.1 kg)  10/12/21 107 lb (48.5 kg)  06/11/21 108 lb 3.2 oz (49.1 kg)    General: Appears her stated age, well developed, well nourished in NAD. Skin: Warm, dry and intact.  Cardiovascular: Normal rate and rhythm. S1,S2 noted.  No murmur, rubs or gallops noted. No JVD or BLE edema.  Pulmonary/Chest: Normal effort and positive vesicular breath sounds. No respiratory distress. No wheezes, rales or ronchi noted.  Musculoskeletal:  No  difficulty with gait.  Neurological: Alert and oriented.  Psychiatric: Mood and affect normal. Behavior is normal. Judgment and thought content normal.        Assessment & Plan:   Peripheral Edema:  Edema has resolved No evidence of heart failure based on exam BMET today Encouraged elevation, low-salt diet  We will follow-up after labs are back, return precautions discussed  Nicki Reaper, NP

## 2022-03-19 LAB — BASIC METABOLIC PANEL WITH GFR
BUN: 10 mg/dL (ref 7–25)
CO2: 28 mmol/L (ref 20–32)
Calcium: 9.7 mg/dL (ref 8.6–10.4)
Chloride: 98 mmol/L (ref 98–110)
Creat: 0.65 mg/dL (ref 0.50–1.05)
Glucose, Bld: 92 mg/dL (ref 65–99)
Potassium: 4.4 mmol/L (ref 3.5–5.3)
Sodium: 134 mmol/L — ABNORMAL LOW (ref 135–146)
eGFR: 100 mL/min/{1.73_m2} (ref >=60)

## 2022-05-31 ENCOUNTER — Encounter: Payer: Self-pay | Admitting: Internal Medicine

## 2022-05-31 ENCOUNTER — Ambulatory Visit: Payer: Self-pay | Admitting: Internal Medicine

## 2022-05-31 VITALS — BP 134/66 | HR 81 | Temp 97.7°F | Ht 65.0 in | Wt 131.0 lb

## 2022-05-31 DIAGNOSIS — E782 Mixed hyperlipidemia: Secondary | ICD-10-CM

## 2022-05-31 DIAGNOSIS — Z0001 Encounter for general adult medical examination with abnormal findings: Secondary | ICD-10-CM

## 2022-05-31 DIAGNOSIS — Z1211 Encounter for screening for malignant neoplasm of colon: Secondary | ICD-10-CM

## 2022-05-31 DIAGNOSIS — Z1159 Encounter for screening for other viral diseases: Secondary | ICD-10-CM

## 2022-05-31 DIAGNOSIS — Z1231 Encounter for screening mammogram for malignant neoplasm of breast: Secondary | ICD-10-CM

## 2022-05-31 DIAGNOSIS — Z114 Encounter for screening for human immunodeficiency virus [HIV]: Secondary | ICD-10-CM

## 2022-05-31 NOTE — Progress Notes (Signed)
Subjective:    Patient ID: Gina Gill, female    DOB: Jan 01, 1961, 61 y.o.   MRN: [id number]  HPI  Patient presents to clinic today for her annual exam.  Flu: never Tetanus: > 10 years ago COVID: never Shingrix: never Pap smear: Hysterectomy Mammogram: > 2 years ago Bone density: never Colon screening: never Vision screening: as needed Dentist: biannually  Diet: She does eat meat. She consumes fruits and veggies. She tries to avoid fried foods. She drinks mostly coffee, tea and water. Exercise: Gym  Review of Systems     Past Medical History:  Diagnosis Date   Osteopenia     Current Outpatient Medications  Medication Sig Dispense Refill   buPROPion (WELLBUTRIN XL) 150 MG 24 hr tablet Take 1 tablet (150 mg total) by mouth daily. 90 tablet 1   meclizine (ANTIVERT) 12.5 MG tablet Take 1 tablet (12.5 mg total) by mouth 3 (three) times daily as needed for dizziness. 30 tablet 0   Multiple Vitamin (MULTIVITAMIN) tablet Take 1 tablet by mouth daily.     No current facility-administered medications for this visit.    No Known Allergies  Family History  Problem Relation Age of Onset   Brain cancer Mother    Stroke Father    Heart disease Sister    Diabetes Sister    Heart disease Brother     Social History   Socioeconomic History   Marital status: Married    Spouse name: Not on file   Number of children: Not on file   Years of education: Not on file   Highest education level: Not on file  Occupational History   Not on file  Tobacco Use   Smoking status: Every Day    Packs/day: 0.50    Years: 30.00    Total pack years: 15.00    Types: Cigarettes   Smokeless tobacco: Never  Vaping Use   Vaping Use: Never used  Substance and Sexual Activity   Alcohol use: Not Currently   Drug use: Never   Sexual activity: Not on file  Other Topics Concern   Not on file  Social History Narrative   Not on file   Social Determinants of Health   Financial  Resource Strain: Not on file  Food Insecurity: Not on file  Transportation Needs: Not on file  Physical Activity: Not on file  Stress: Not on file  Social Connections: Not on file  Intimate Partner Violence: Not on file     Constitutional: Denies fever, malaise, fatigue, headache or abrupt weight changes.  HEENT: Denies eye pain, eye redness, ear pain, ringing in the ears, wax buildup, runny nose, nasal congestion, bloody nose, or sore throat. Respiratory: Denies difficulty breathing, shortness of breath, cough or sputum production.   Cardiovascular: Denies chest pain, chest tightness, palpitations or swelling in the hands or feet.  Gastrointestinal: Denies abdominal pain, bloating, constipation, diarrhea or blood in the stool.  GU: Denies urgency, frequency, pain with urination, burning sensation, blood in urine, odor or discharge. Musculoskeletal: Denies decrease in range of motion, difficulty with gait, muscle pain or joint pain and swelling.  Skin: Denies redness, rashes, lesions or ulcercations.  Neurological: Denies dizziness, difficulty with memory, difficulty with speech or problems with balance and coordination.  Psych: Patient has a history of anxiety.  Denies depression, SI/HI.  No other specific complaints in a complete review of systems (except as listed in HPI above).  Objective:   Physical Exam  BP 134/66 (BP  Location: Left Arm, Patient Position: Sitting, Cuff Size: Normal)   Pulse 81   Temp 97.7 F (36.5 C) (Temporal)   Ht $R'5\' 5"'xo$  (1.651 m)   Wt 131 lb (59.4 kg)   SpO2 97%   BMI 21.80 kg/m   Wt Readings from Last 3 Encounters:  03/18/22 128 lb (58.1 kg)  01/28/22 123 lb 9.6 oz (56.1 kg)  10/12/21 107 lb (48.5 kg)    General: Appears her stated age, well developed, well nourished in NAD. Skin: Warm, dry and intact.  HEENT: Head: normal shape and size; Eyes: sclera white, no icterus, conjunctiva pink, PERRLA and EOMs intact;  Neck:  Neck supple, trachea  midline. No masses, lumps or thyromegaly present.  Cardiovascular: Normal rate and rhythm. S1,S2 noted.  No murmur, rubs or gallops noted. No JVD or BLE edema. No carotid bruits noted. Pulmonary/Chest: Normal effort and positive vesicular breath sounds. No respiratory distress. No wheezes, rales or ronchi noted.  Abdomen:  Normal bowel sounds.  Musculoskeletal: Strength 5/5 BUE/BLE. No difficulty with gait.  Neurological: Alert and oriented. Cranial nerves II-XII grossly intact. Coordination normal.  Psychiatric: Mood and affect normal. Behavior is normal. Judgment and thought content normal.    BMET    Component Value Date/Time   NA 134 (L) 03/18/2022 1102   K 4.4 03/18/2022 1102   CL 98 03/18/2022 1102   CO2 28 03/18/2022 1102   GLUCOSE 92 03/18/2022 1102   BUN 10 03/18/2022 1102   CREATININE 0.65 03/18/2022 1102   CALCIUM 9.7 03/18/2022 1102    Lipid Panel  No results found for: "CHOL", "TRIG", "HDL", "CHOLHDL", "VLDL", "LDLCALC"  CBC No results found for: "WBC", "RBC", "HGB", "HCT", "PLT", "MCV", "MCH", "MCHC", "RDW", "LYMPHSABS", "MONOABS", "EOSABS", "BASOSABS"  Hgb A1C No results found for: "HGBA1C"         Assessment & Plan:   Preventative Health Maintenance:  She declines flu shot today She declines tetanus vaccine Encouraged her to get a COVID-vaccine Discussed Shingrix vaccine, she will check coverage with her insurance company and schedule a nurse visit if she would like to have this done She no longer needs Pap smears She declines mammogram and bone density ordered Cologuard ordered Encouraged her to consume a balanced diet and exercise regimen Advised her to see an eye doctor and dentist annually We will check CBC, c-Met, lipid, A1c, HIV and hep C today  RTC in 1 year, sooner if needed Webb Silversmith, NP

## 2022-05-31 NOTE — Patient Instructions (Signed)
Health Maintenance for Postmenopausal Women Menopause is a normal process in which your ability to get pregnant comes to an end. This process happens slowly over many months or years, usually between the ages of 48 and 55. Menopause is complete when you have missed your menstrual period for 12 months. It is important to talk with your health care provider about some of the most common conditions that affect women after menopause (postmenopausal women). These include heart disease, cancer, and bone loss (osteoporosis). Adopting a healthy lifestyle and getting preventive care can help to promote your health and wellness. The actions you take can also lower your chances of developing some of these common conditions. What are the signs and symptoms of menopause? During menopause, you may have the following symptoms: Hot flashes. These can be moderate or severe. Night sweats. Decrease in sex drive. Mood swings. Headaches. Tiredness (fatigue). Irritability. Memory problems. Problems falling asleep or staying asleep. Talk with your health care provider about treatment options for your symptoms. Do I need hormone replacement therapy? Hormone replacement therapy is effective in treating symptoms that are caused by menopause, such as hot flashes and night sweats. Hormone replacement carries certain risks, especially as you become older. If you are thinking about using estrogen or estrogen with progestin, discuss the benefits and risks with your health care provider. How can I reduce my risk for heart disease and stroke? The risk of heart disease, heart attack, and stroke increases as you age. One of the causes may be a change in the body's hormones during menopause. This can affect how your body uses dietary fats, triglycerides, and cholesterol. Heart attack and stroke are medical emergencies. There are many things that you can do to help prevent heart disease and stroke. Watch your blood pressure High  blood pressure causes heart disease and increases the risk of stroke. This is more likely to develop in people who have high blood pressure readings or are overweight. Have your blood pressure checked: Every 3-5 years if you are 18-39 years of age. Every year if you are 40 years old or older. Eat a healthy diet  Eat a diet that includes plenty of vegetables, fruits, low-fat dairy products, and lean protein. Do not eat a lot of foods that are high in solid fats, added sugars, or sodium. Get regular exercise Get regular exercise. This is one of the most important things you can do for your health. Most adults should: Try to exercise for at least 150 minutes each week. The exercise should increase your heart rate and make you sweat (moderate-intensity exercise). Try to do strengthening exercises at least twice each week. Do these in addition to the moderate-intensity exercise. Spend less time sitting. Even light physical activity can be beneficial. Other tips Work with your health care provider to achieve or maintain a healthy weight. Do not use any products that contain nicotine or tobacco. These products include cigarettes, chewing tobacco, and vaping devices, such as e-cigarettes. If you need help quitting, ask your health care provider. Know your numbers. Ask your health care provider to check your cholesterol and your blood sugar (glucose). Continue to have your blood tested as directed by your health care provider. Do I need screening for cancer? Depending on your health history and family history, you may need to have cancer screenings at different stages of your life. This may include screening for: Breast cancer. Cervical cancer. Lung cancer. Colorectal cancer. What is my risk for osteoporosis? After menopause, you may be   at increased risk for osteoporosis. Osteoporosis is a condition in which bone destruction happens more quickly than new bone creation. To help prevent osteoporosis or  the bone fractures that can happen because of osteoporosis, you may take the following actions: If you are 19-50 years old, get at least 1,000 mg of calcium and at least 600 international units (IU) of vitamin D per day. If you are older than age 50 but younger than age 70, get at least 1,200 mg of calcium and at least 600 international units (IU) of vitamin D per day. If you are older than age 70, get at least 1,200 mg of calcium and at least 800 international units (IU) of vitamin D per day. Smoking and drinking excessive alcohol increase the risk of osteoporosis. Eat foods that are rich in calcium and vitamin D, and do weight-bearing exercises several times each week as directed by your health care provider. How does menopause affect my mental health? Depression may occur at any age, but it is more common as you become older. Common symptoms of depression include: Feeling depressed. Changes in sleep patterns. Changes in appetite or eating patterns. Feeling an overall lack of motivation or enjoyment of activities that you previously enjoyed. Frequent crying spells. Talk with your health care provider if you think that you are experiencing any of these symptoms. General instructions See your health care provider for regular wellness exams and vaccines. This may include: Scheduling regular health, dental, and eye exams. Getting and maintaining your vaccines. These include: Influenza vaccine. Get this vaccine each year before the flu season begins. Pneumonia vaccine. Shingles vaccine. Tetanus, diphtheria, and pertussis (Tdap) booster vaccine. Your health care provider may also recommend other immunizations. Tell your health care provider if you have ever been abused or do not feel safe at home. Summary Menopause is a normal process in which your ability to get pregnant comes to an end. This condition causes hot flashes, night sweats, decreased interest in sex, mood swings, headaches, or lack  of sleep. Treatment for this condition may include hormone replacement therapy. Take actions to keep yourself healthy, including exercising regularly, eating a healthy diet, watching your weight, and checking your blood pressure and blood sugar levels. Get screened for cancer and depression. Make sure that you are up to date with all your vaccines. This information is not intended to replace advice given to you by your health care provider. Make sure you discuss any questions you have with your health care provider. Document Revised: 01/19/2021 Document Reviewed: 01/19/2021 Elsevier Patient Education  2023 Elsevier Inc.  

## 2022-06-01 LAB — COMPLETE METABOLIC PANEL WITH GFR
AG Ratio: 2 (calc) (ref 1.0–2.5)
ALT: 14 U/L (ref 6–29)
AST: 18 U/L (ref 10–35)
Albumin: 4.7 g/dL (ref 3.6–5.1)
Alkaline phosphatase (APISO): 88 U/L (ref 37–153)
BUN: 11 mg/dL (ref 7–25)
CO2: 27 mmol/L (ref 20–32)
Calcium: 9.7 mg/dL (ref 8.6–10.4)
Chloride: 99 mmol/L (ref 98–110)
Creat: 0.81 mg/dL (ref 0.50–1.05)
Globulin: 2.3 g/dL (calc) (ref 1.9–3.7)
Glucose, Bld: 93 mg/dL (ref 65–139)
Potassium: 3.8 mmol/L (ref 3.5–5.3)
Sodium: 137 mmol/L (ref 135–146)
Total Bilirubin: 0.4 mg/dL (ref 0.2–1.2)
Total Protein: 7 g/dL (ref 6.1–8.1)
eGFR: 83 mL/min/{1.73_m2} (ref >=60)

## 2022-06-01 LAB — TEST AUTHORIZATION: TEST CODE:: 91431

## 2022-06-01 LAB — CBC
HCT: 37.7 % (ref 35.0–45.0)
Hemoglobin: 13 g/dL (ref 11.7–15.5)
MCH: 31.5 pg (ref 27.0–33.0)
MCHC: 34.5 g/dL (ref 32.0–36.0)
MCV: 91.3 fL (ref 80.0–100.0)
MPV: 10.1 fL (ref 7.5–12.5)
Platelets: 270 10*3/uL (ref 140–400)
RBC: 4.13 10*6/uL (ref 3.80–5.10)
RDW: 11.8 % (ref 11.0–15.0)
WBC: 6.2 10*3/uL (ref 3.8–10.8)

## 2022-06-01 LAB — LIPID PANEL
Cholesterol: 236 mg/dL — ABNORMAL HIGH (ref <200)
HDL: 82 mg/dL (ref >=50)
LDL Cholesterol (Calc): 127 mg/dL (calc) — ABNORMAL HIGH
Non-HDL Cholesterol (Calc): 154 mg/dL (calc) — ABNORMAL HIGH (ref <130)
Total CHOL/HDL Ratio: 2.9 (calc) (ref <5.0)
Triglycerides: 155 mg/dL — ABNORMAL HIGH (ref <150)

## 2022-06-02 NOTE — Addendum Note (Signed)
Addended by: Jearld Fenton on: 06/02/2022 07:58 AM   Modules accepted: Orders

## 2022-08-25 ENCOUNTER — Other Ambulatory Visit: Payer: Self-pay | Admitting: Internal Medicine

## 2022-08-25 ENCOUNTER — Telehealth: Payer: Self-pay

## 2022-08-25 NOTE — Telephone Encounter (Signed)
Requested Prescriptions  Pending Prescriptions Disp Refills   buPROPion (WELLBUTRIN XL) 150 MG 24 hr tablet [Pharmacy Med Name: BUPROPION HCL XL 150 MG TABLET] 90 tablet 1    Sig: TAKE 1 TABLET BY MOUTH EVERY DAY     Psychiatry: Antidepressants - bupropion Passed - 08/25/2022 12:45 AM      Passed - Cr in normal range and within 360 days    Creat  Date Value Ref Range Status  05/31/2022 0.81 0.50 - 1.05 mg/dL Final         Passed - AST in normal range and within 360 days    AST  Date Value Ref Range Status  05/31/2022 18 10 - 35 U/L Final         Passed - ALT in normal range and within 360 days    ALT  Date Value Ref Range Status  05/31/2022 14 6 - 29 U/L Final         Passed - Last BP in normal range    BP Readings from Last 1 Encounters:  05/31/22 134/66         Passed - Valid encounter within last 6 months    Recent Outpatient Visits           2 months ago Encounter for general adult medical examination with abnormal findings   [facility] [place], Salvadore Oxford, NP   5 months ago Peripheral edema   [facility] [place], [place], NP   6 months ago Urinary tract infection with hematuria, site unspecified   [facility] [place], Salvadore Oxford, NP   10 months ago Acute non-recurrent ethmoidal sinusitis   [facility] [place], Salvadore Oxford, NP   1 year ago Viral sinusitis   [facility] [place], Salvadore Oxford, NP

## 2022-08-25 NOTE — Telephone Encounter (Unsigned)
Copied from CRM [id number]. Topic: General - Other >> Aug 24, 2022  2:05 PM Dominique A wrote: Patient is wanting to see if a letter can be sent over to her insurance company. Per patient she stopped smoking 11yr ago and it is still showing up that she is a smoker. Per patient she is needing a letter from her PCP on letter head stating that she stopped smoking 31yr ago. Patient is wanting to see if the letter can be emailed over to her insurance company at [email or web address]

## 2022-08-26 NOTE — Telephone Encounter (Signed)
I would need to check a blood Contine[place] to verify that she is no longer smoking before I can send a letter.  She can schedule a lab only appointment if she would like to have this done.

## 2022-08-26 NOTE — Telephone Encounter (Signed)
Pt did have the blood work but will also need a note from you.  She is going to bring the copy of the lab to you.   Thanks,   -Vernona Rieger

## 2022-09-08 ENCOUNTER — Other Ambulatory Visit: Payer: Self-pay

## 2022-09-08 DIAGNOSIS — E782 Mixed hyperlipidemia: Secondary | ICD-10-CM

## 2022-09-09 LAB — LIPID PANEL
Cholesterol: 201 mg/dL — ABNORMAL HIGH (ref <200)
HDL: 77 mg/dL (ref >=50)
LDL Cholesterol (Calc): 106 mg/dL (calc) — ABNORMAL HIGH
Non-HDL Cholesterol (Calc): 124 mg/dL (calc) (ref <130)
Total CHOL/HDL Ratio: 2.6 (calc) (ref <5.0)
Triglycerides: 86 mg/dL (ref <150)

## 2022-09-22 ENCOUNTER — Ambulatory Visit: Payer: Self-pay | Admitting: *Deleted

## 2022-09-22 NOTE — Telephone Encounter (Signed)
Call disconnected from agent NT called patient back. Pt given lab results per notes of R. Baity, NP from 09/09/22 on 09/22/22. Pt verbalized understanding cholesterol has improved and consume a low saturated fat diet.

## 2022-11-11 ENCOUNTER — Ambulatory Visit (INDEPENDENT_AMBULATORY_CARE_PROVIDER_SITE_OTHER): Payer: Self-pay | Admitting: Internal Medicine

## 2022-11-11 ENCOUNTER — Encounter: Payer: Self-pay | Admitting: Internal Medicine

## 2022-11-11 VITALS — BP 124/68 | HR 84 | Temp 96.9°F | Wt 136.0 lb

## 2022-11-11 DIAGNOSIS — J329 Chronic sinusitis, unspecified: Secondary | ICD-10-CM

## 2022-11-11 DIAGNOSIS — B9789 Other viral agents as the cause of diseases classified elsewhere: Secondary | ICD-10-CM

## 2022-11-11 MED ORDER — AMOXICILLIN 875 MG PO TABS: 875.0000 mg | ORAL_TABLET | Freq: Two times a day (BID) | ORAL | 0 refills | Status: AC

## 2022-11-11 NOTE — Patient Instructions (Signed)
Sinus Pain  Sinus pain may occur when your sinuses become clogged or swollen. Sinuses are air-filled spaces in your skull that are behind the bones of your face and forehead. Sinus pain can range from mild to severe. What are the causes? Sinus pain can result from various conditions that affect the sinuses. Common causes include: Colds. Sinus infections. Allergies. What are the signs or symptoms? The main symptom of this condition is pain or pressure in your face, forehead, ears, or upper teeth. People who have sinus pain often have other symptoms, such as: Congested or runny nose. Fever. Inability to smell. Headache. Weather changes can make symptoms worse. How is this diagnosed? Your health care provider will diagnose this condition based on your symptoms and a physical exam. If you have pain that keeps coming back or does not go away, your health care provider may recommend more testing. This may include: Imaging tests, such as a CT scan or MRI, to check for problems with your sinuses. Examination of your sinuses using a thin tool with a camera that is inserted through your nose (endoscopy). How is this treated? Treatment for this condition depends on the cause. Sinus pain that is caused by a sinus infection may be treated with antibiotic medicine. Sinus pain that is caused by congestion may be helped by rinsing out (flushing) the nose and sinuses with saline solution. Sinus pain that is caused by allergies may be helped by allergy medicines (antihistamines) and medicated nasal sprays. Sinus surgery may be needed in some cases if other treatments do not help. Follow these instructions at home: General instructions If directed: Apply a warm, moist washcloth to your face to help relieve pain. Use a nasal saline wash. Follow the directions on the bottle or box. Hydrate and humidify Drink enough water to keep your urine clear or pale yellow. Staying hydrated will help to thin your  mucus. Use a humidifier if your home is dry. Inhale steam for 10-15 minutes, 3-4 times a day or as told by your health care provider. You can do this in the bathroom while a hot shower is running. Limit your exposure to cool or dry air. Medicines  Take over-the-counter and prescription medicines only as told by your health care provider. If you were prescribed an antibiotic medicine, take it as told by your health care provider. Do not stop taking the antibiotic even if you start to feel better. If you have congestion, use a nasal spray to help lessen pressure. Contact a health care provider if: You have sinus pain more than one time a week. You have sensitivity to light or sound. You develop a fever. You feel nauseous or you vomit. Your sinus pain or headache does not get better with treatment. Get help right away if: You have vision problems. You have sudden, severe pain in your face or head. You have a seizure. You are confused. You have a stiff neck. Summary Sinus pain occurs when your sinuses become clogged or swollen. Sinus pain can result from various conditions that affect the sinuses, such as a cold, a sinus infection, or an allergy. Treatment for this condition depends on the cause. It may include medicine, such as antibiotics or antihistamines. This information is not intended to replace advice given to you by your health care provider. Make sure you discuss any questions you have with your health care provider. Document Revised: 08/02/2021 Document Reviewed: 08/02/2021 Elsevier Patient Education  [place].

## 2022-11-11 NOTE — Progress Notes (Signed)
HPI  Pt presents to the clinic today with c/o headache, sinus pressure, runny nose and cough. This started 4 days ago.  The headache is located in her forehead.  She describes the pain as pressure.  She is blowing clear mucus out of her nose.  The cough is productive of green mucus.  She denies nasal congestion, ear pain, sore throat, shortness of breath, nausea, vomiting or diarrhea.  She denies fever, chills or body aches.  She is taking Mucinex OTC with minimal relief of symptoms. She has not had sick contacts.  Review of Systems     Past Medical History:  Diagnosis Date   Osteopenia     Family History  Problem Relation Age of Onset   Brain cancer Mother    Stroke Father    Heart disease Sister    Diabetes Sister    Heart disease Brother    Heart disease Brother     Social History   Socioeconomic History   Marital status: Married    Spouse name: Not on file   Number of children: Not on file   Years of education: Not on file   Highest education level: Not on file  Occupational History   Not on file  Tobacco Use   Smoking status: Former    Packs/day: 0.50    Years: 30.00    Total pack years: 15.00    Types: Cigarettes    Quit date: 10/14/2021    Years since quitting: 1.0   Smokeless tobacco: Never  Vaping Use   Vaping Use: Never used  Substance and Sexual Activity   Alcohol use: Not Currently   Drug use: Never   Sexual activity: Not on file  Other Topics Concern   Not on file  Social History Narrative   Not on file   Social Determinants of Health   Financial Resource Strain: Not on file  Food Insecurity: Not on file  Transportation Needs: Not on file  Physical Activity: Not on file  Stress: Not on file  Social Connections: Not on file  Intimate Partner Violence: Not on file    No Known Allergies   Constitutional: Positive headache. Denies fatigue, fever or abrupt weight changes.  HEENT:  Positive sinus pressure, runny nose. Denies eye redness, ear  pain, ringing in the ears, wax buildup, nasal congestion or sore throat. Respiratory: Positive cough. Denies difficulty breathing or shortness of breath.  Cardiovascular: Denies chest pain, chest tightness, palpitations or swelling in the hands or feet.   No other specific complaints in a complete review of systems (except as listed in HPI above).  Objective:   BP 124/68 (BP Location: Left Arm, Patient Position: Sitting, Cuff Size: Normal)   Pulse 84   Temp (!) 96.9 F (36.1 C) (Temporal)   Wt 136 lb (61.7 kg)   SpO2 96%   BMI 22.63 kg/m    General: Appears her stated age, well developed, well nourished in NAD. HEENT: Head: normal shape and size, ethmoidal sinus tenderness noted; Eyes: sclera white, no icterus, conjunctiva pink; Ears: Tm's gray and intact, normal light reflex; Nose: mucosa pink and moist, septum midline; Throat/Mouth: + PND. Teeth present, mucosa erythematous and moist, no exudate noted, no lesions or ulcerations noted.  Neck:  No adenopathy noted.  Cardiovascular: Normal rate and rhythm. S1,S2 noted.  No murmur, rubs or gallops noted.  Pulmonary/Chest: Normal effort and positive vesicular breath sounds. No respiratory distress. No wheezes, rales or ronchi noted.  Assessment & Plan:   Viral Ethmoidal Sinusitis  Can use a Neti Pot which can be purchased from your local drug store. Flonase 2 sprays each nostril for 3 days and then as needed. eRx for amoxicillin 875 mg twice daily for 10 days if symptoms are worsening  RTC in 6 months for annual exam Webb Silversmith, NP

## 2023-01-21 ENCOUNTER — Encounter: Payer: Self-pay | Admitting: Physician Assistant

## 2023-01-21 ENCOUNTER — Ambulatory Visit (INDEPENDENT_AMBULATORY_CARE_PROVIDER_SITE_OTHER): Payer: Self-pay | Admitting: Physician Assistant

## 2023-01-21 ENCOUNTER — Ambulatory Visit: Payer: Self-pay | Admitting: *Deleted

## 2023-01-21 VITALS — BP 114/65 | HR 63 | Temp 98.8°F | Wt 137.7 lb

## 2023-01-21 DIAGNOSIS — R1013 Epigastric pain: Secondary | ICD-10-CM

## 2023-01-21 NOTE — Telephone Encounter (Signed)
  Chief Complaint: upper abdominal pain right side Symptoms: upper abdominal pain started yesterday gradual and now more constant. Woke up from sleeping with pain. Radiates to right side and back. Has not eaten food today only drinking fluids  Frequency: yesterday  Pertinent Negatives: Patient denies fever, no chest pain no difficulty breathing no diarrhea no constipation Disposition: [] ED /[] Urgent Care (no appt availability in office) / [x] Appointment(In office/virtual)/ []  [facility] [place] [facility]/ [] Home Care/ [] Refused Recommended Disposition /[]  Mobile Bus/ []  Follow-up with PCP Additional Notes:   No available OV with PCP or Dr. Kirtland Bouchard today . Appt scheduled with E. Mecum , PA at [facility]. Patient aware and agrees to go to different office for evaluation.     Reason for Disposition  [1] MILD-MODERATE pain AND [2] constant AND [3] present > 2 hours  Answer Assessment - Initial Assessment Questions 1. LOCATION: "Where does it hurt?"      Right side below diaphragm 2. RADIATION: "Does the pain shoot anywhere else?" (e.g., chest, back)     Towards back 3. ONSET: "When did the pain begin?" (e.g., minutes, hours or days ago)      Yesterday  4. SUDDEN: "Gradual or sudden onset?"     Gradual  5. PATTERN "Does the pain come and go, or is it constant?"    - If it comes and goes: "How long does it last?" "Do you have pain now?"     (Note: Comes and goes means the pain is intermittent. It goes away completely between bouts.)    - If constant: "Is it getting better, staying the same, or getting worse?"      (Note: Constant means the pain never goes away completely; most serious pain is constant and gets worse.)      Now more constant  6. SEVERITY: "How bad is the pain?"  (e.g., Scale 1-10; mild, moderate, or severe)    - MILD (1-3): Doesn't interfere with normal activities, abdomen soft and not tender to touch..     - MODERATE (4-7): Interferes with normal activities or awakens from sleep,  abdomen tender to touch.     - SEVERE (8-10): Excruciating pain, doubled over, unable to do any normal activities.       Waking up from a sleep  7. RECURRENT SYMPTOM: "Have you ever had this type of stomach pain before?" If Yes, ask: "When was the last time?" and "What happened that time?"      Na  8. AGGRAVATING FACTORS: "Does anything seem to cause this pain?" (e.g., foods, stress, alcohol)     Has not eaten today  9. CARDIAC SYMPTOMS: "Do you have any of the following symptoms: chest pain, difficulty breathing, sweating, nausea?"     denies 10. OTHER SYMPTOMS: "Do you have any other symptoms?" (e.g., back pain, diarrhea, fever, urination pain, vomiting)       Right side upper abdominal area radiated to back 11. PREGNANCY: "Is there any chance you are pregnant?" "When was your last menstrual period?"       na  Protocols used: Abdominal Pain - Upper-A-AH

## 2023-01-21 NOTE — Progress Notes (Signed)
Acute Office Visit   Patient: Gina Gill   DOB: [birth year]   62 y.o. Female  MRN: [id number] Visit Date: 01/21/2023  Today's healthcare provider: Oswaldo Conroy Keontre Defino, PA-C  Introduced myself to the patient as a Secondary school teacher and provided education on APPs in clinical practice.    Chief Complaint  Patient presents with   Abdominal Pain    Pt states she first noticed pain in her upper abdomen yesterday afternoon, states the pain has gotten worse since the middle of the night. States she has felt the pain move around to her back on the right side. States she could also feel the pain in her lower right abdomen as well. States pain was at a 10 last night, rating at a 2 right now.    Subjective    HPI HPI     Abdominal Pain    Additional comments: Pt states she first noticed pain in her upper abdomen yesterday afternoon, states the pain has gotten worse since the middle of the night. States she has felt the pain move around to her back on the right side. States she could also feel the pain in her lower right abdomen as well. States pain was at a 10 last night, rating at a 2 right now.       Last edited by Pablo Ledger, CMA on 01/21/2023  3:02 PM.       Concern for Abdominal pain   Onset: sudden  Duration: started yesterday around 3-4 pm in epigastric area  Location: epigastric pain that radiates along left and right upper quadrants  Radiation: radiates along upper quadrants and into back  Pain level and character: last night 10/10 constant, now 2/10   Triggers: none to her knowledge, denies eating new foods or doing anything out of the ordinary  Other associated symptoms: denies heartburn symptoms, denies nausea, vomiting, diarrhea  Denies similar symptoms in others in home  Interventions: TUMS  Alleviating: nothing, just seemed to resolve on it's own around 8 AM  Aggravating: nothing    Medications: Outpatient Medications Prior to Visit  Medication Sig   buPROPion  (WELLBUTRIN XL) 150 MG 24 hr tablet TAKE 1 TABLET BY MOUTH EVERY DAY   fexofenadine (ALLEGRA) 180 MG tablet Take 180 mg by mouth daily.   meclizine (ANTIVERT) 12.5 MG tablet Take 1 tablet (12.5 mg total) by mouth 3 (three) times daily as needed for dizziness.   Multiple Vitamin (MULTIVITAMIN) tablet Take 1 tablet by mouth daily.   No facility-administered medications prior to visit.    Review of Systems  Constitutional:  Negative for chills, fatigue and fever.  Eyes:  Negative for visual disturbance.  Gastrointestinal:  Positive for abdominal pain. Negative for blood in stool, constipation, diarrhea, nausea and vomiting.  Genitourinary:  Positive for flank pain. Negative for dysuria and hematuria.  Neurological:  Negative for dizziness and light-headedness.    {Labs  Heme  Chem  Endocrine  Serology  Results Review (optional):23779}   Objective    BP 114/65   Pulse 63   Temp 98.8 F (37.1 C) (Oral)   Wt 137 lb 11.2 oz (62.5 kg)   SpO2 93%   BMI 22.91 kg/m  {Show previous vital signs (optional):23777}  Physical Exam Vitals reviewed.  Constitutional:      General: She is awake.     Appearance: Normal appearance. She is well-developed and well-groomed.  HENT:     Head: Normocephalic and atraumatic.  Neurological:     Mental Status: She is alert.  Psychiatric:        Behavior: Behavior is cooperative.       No results found for any visits on 01/21/23.  Assessment & Plan      No follow-ups on file.

## 2023-01-24 NOTE — Telephone Encounter (Signed)
I have an 1140 that has been open all day.  Will review Erin Mecum's note once patient has been seen.

## 2023-03-08 ENCOUNTER — Other Ambulatory Visit: Payer: Self-pay | Admitting: Internal Medicine

## 2023-03-08 NOTE — Telephone Encounter (Signed)
Requested Prescriptions  Pending Prescriptions Disp Refills   buPROPion (WELLBUTRIN XL) 150 MG 24 hr tablet [Pharmacy Med Name: BUPROPION HCL XL 150 MG TABLET] 90 tablet 0    Sig: TAKE 1 TABLET BY MOUTH EVERY DAY     Psychiatry: Antidepressants - bupropion Passed - 03/08/2023  2:25 AM      Passed - Cr in normal range and within 360 days    Creat  Date Value Ref Range Status  05/31/2022 0.81 0.50 - 1.05 mg/dL Final         Passed - AST in normal range and within 360 days    AST  Date Value Ref Range Status  05/31/2022 18 10 - 35 U/L Final         Passed - ALT in normal range and within 360 days    ALT  Date Value Ref Range Status  05/31/2022 14 6 - 29 U/L Final         Passed - Last BP in normal range    BP Readings from Last 1 Encounters:  01/21/23 114/65         Passed - Valid encounter within last 6 months    Recent Outpatient Visits           1 month ago Intermittent epigastric abdominal pain   [place] [facility] [place], Oswaldo Conroy, PA-C   3 months ago Viral sinusitis   [place] [place], Salvadore Oxford, NP   9 months ago Encounter for general adult medical examination with abnormal findings   [place] [facility] [place], Salvadore Oxford, NP   11 months ago Peripheral edema   [place] [facility] [place], Salvadore Oxford, NP   1 year ago Urinary tract infection with hematuria, site unspecified   [facility] [place], Salvadore Oxford, [place]

## 2023-05-19 ENCOUNTER — Ambulatory Visit: Payer: Self-pay | Admitting: Internal Medicine

## 2023-05-19 DIAGNOSIS — E7800 Pure hypercholesterolemia, unspecified: Secondary | ICD-10-CM | POA: Insufficient documentation

## 2023-05-19 NOTE — Progress Notes (Deleted)
Subjective:    Patient ID: Gina Gill, female    DOB: 1961-03-03, 62 y.o.   MRN: [id number]  HPI  Patient presents to clinic today with complaint of.  Review of Systems  Past Medical History:  Diagnosis Date   Osteopenia     Current Outpatient Medications  Medication Sig Dispense Refill   buPROPion (WELLBUTRIN XL) 150 MG 24 hr tablet TAKE 1 TABLET BY MOUTH EVERY DAY 90 tablet 0   fexofenadine (ALLEGRA) 180 MG tablet Take 180 mg by mouth daily.     meclizine (ANTIVERT) 12.5 MG tablet Take 1 tablet (12.5 mg total) by mouth 3 (three) times daily as needed for dizziness. 30 tablet 0   Multiple Vitamin (MULTIVITAMIN) tablet Take 1 tablet by mouth daily.     No current facility-administered medications for this visit.    No Known Allergies  Family History  Problem Relation Age of Onset   Brain cancer Mother    Stroke Father    Heart disease Sister    Diabetes Sister    Heart disease Brother    Heart disease Brother     Social History   Socioeconomic History   Marital status: Married    Spouse name: Not on file   Number of children: Not on file   Years of education: Not on file   Highest education level: Not on file  Occupational History   Not on file  Tobacco Use   Smoking status: Former    Current packs/day: 0.00    Average packs/day: 0.5 packs/day for 30.0 years (15.0 ttl pk-yrs)    Types: Cigarettes    Start date: 10/15/1991    Quit date: 10/14/2021    Years since quitting: 1.5   Smokeless tobacco: Never  Vaping Use   Vaping status: Never Used  Substance and Sexual Activity   Alcohol use: Not Currently   Drug use: Never   Sexual activity: Not on file  Other Topics Concern   Not on file  Social History Narrative   Not on file   Social Determinants of Health   Financial Resource Strain: Not on file  Food Insecurity: Not on file  Transportation Needs: Not on file  Physical Activity: Not on file  Stress: Not on file  Social Connections: Not on  file  Intimate Partner Violence: Not on file     Constitutional: Denies fever, malaise, fatigue, headache or abrupt weight changes.  HEENT: Denies eye pain, eye redness, ear pain, ringing in the ears, wax buildup, runny nose, nasal congestion, bloody nose, or sore throat. Respiratory: Denies difficulty breathing, shortness of breath, cough or sputum production.   Cardiovascular: Denies chest pain, chest tightness, palpitations or swelling in the hands or feet.  Gastrointestinal: Denies abdominal pain, bloating, constipation, diarrhea or blood in the stool.  GU: Denies urgency, frequency, pain with urination, burning sensation, blood in urine, odor or discharge. Musculoskeletal: Denies decrease in range of motion, difficulty with gait, muscle pain or joint pain and swelling.  Skin: Denies redness, rashes, lesions or ulcercations.  Neurological: Denies dizziness, difficulty with memory, difficulty with speech or problems with balance and coordination.  Psych: Patient has a history of anxiety.  Denies depression, SI/HI.  No other specific complaints in a complete review of systems (except as listed in HPI above).     Objective:   Physical Exam  There were no vitals taken for this visit. Wt Readings from Last 3 Encounters:  01/21/23 137 lb 11.2 oz (62.5 kg)  11/11/22 136 lb (61.7 kg)  05/31/22 131 lb (59.4 kg)    General: Appears their stated age, well developed, well nourished in NAD. Skin: Warm, dry and intact. No rashes, lesions or ulcerations noted. HEENT: Head: normal shape and size; Eyes: sclera white, no icterus, conjunctiva pink, PERRLA and EOMs intact; Ears: Tm's gray and intact, normal light reflex; Nose: mucosa pink and moist, septum midline; Throat/Mouth: Teeth present, mucosa pink and moist, no exudate, lesions or ulcerations noted.  Neck:  Neck supple, trachea midline. No masses, lumps or thyromegaly present.  Cardiovascular: Normal rate and rhythm. S1,S2 noted.  No murmur,  rubs or gallops noted. No JVD or BLE edema. No carotid bruits noted. Pulmonary/Chest: Normal effort and positive vesicular breath sounds. No respiratory distress. No wheezes, rales or ronchi noted.  Abdomen: Soft and nontender. Normal bowel sounds. No distention or masses noted. Liver, spleen and kidneys non palpable. Musculoskeletal: Normal range of motion. No signs of joint swelling. No difficulty with gait.  Neurological: Alert and oriented. Cranial nerves II-XII grossly intact. Coordination normal.  Psychiatric: Mood and affect normal. Behavior is normal. Judgment and thought content normal.    BMET    Component Value Date/Time   NA 137 05/31/2022 1542   K 3.8 05/31/2022 1542   CL 99 05/31/2022 1542   CO2 27 05/31/2022 1542   GLUCOSE 93 05/31/2022 1542   BUN 11 05/31/2022 1542   CREATININE 0.81 05/31/2022 1542   CALCIUM 9.7 05/31/2022 1542    Lipid Panel     Component Value Date/Time   CHOL 201 (H) 09/08/2022 1023   TRIG 86 09/08/2022 1023   HDL 77 09/08/2022 1023   CHOLHDL 2.6 09/08/2022 1023   LDLCALC 106 (H) 09/08/2022 1023    CBC    Component Value Date/Time   WBC 6.2 05/31/2022 1542   RBC 4.13 05/31/2022 1542   HGB 13.0 05/31/2022 1542   HCT 37.7 05/31/2022 1542   PLT 270 05/31/2022 1542   MCV 91.3 05/31/2022 1542   MCH 31.5 05/31/2022 1542   MCHC 34.5 05/31/2022 1542   RDW 11.8 05/31/2022 1542    Hgb A1C No results found for: "HGBA1C"          Assessment & Plan:    RTC in 1 year for follow-up of chronic conditions Nicki Reaper, NP

## 2023-06-10 ENCOUNTER — Other Ambulatory Visit: Payer: Self-pay | Admitting: Internal Medicine

## 2023-06-10 NOTE — Telephone Encounter (Signed)
Requested medications are due for refill today.  yes  Requested medications are on the active medications list.  yes  Last refill. 03/08/2023 #90 0 rf  Future visit scheduled.   no  Notes to clinic.  Labs are expired. Pt needs an ov.    Requested Prescriptions  Pending Prescriptions Disp Refills   buPROPion (WELLBUTRIN XL) 150 MG 24 hr tablet [Pharmacy Med Name: BUPROPION HCL XL 150 MG TABLET] 90 tablet 0    Sig: TAKE 1 TABLET BY MOUTH EVERY DAY     Psychiatry: Antidepressants - bupropion Failed - 06/10/2023  1:25 AM      Failed - Cr in normal range and within 360 days    Creat  Date Value Ref Range Status  05/31/2022 0.81 0.50 - 1.05 mg/dL Final         Failed - AST in normal range and within 360 days    AST  Date Value Ref Range Status  05/31/2022 18 10 - 35 U/L Final         Failed - ALT in normal range and within 360 days    ALT  Date Value Ref Range Status  05/31/2022 14 6 - 29 U/L Final         Failed - Valid encounter within last 6 months    Recent Outpatient Visits           4 months ago Intermittent epigastric abdominal pain   [place] [facility] [place], Oswaldo Conroy, PA-C   7 months ago Viral sinusitis   [place] [facility] [place], Salvadore Oxford, NP   1 year ago Encounter for general adult medical examination with abnormal findings   [place] [facility] [place], Salvadore Oxford, NP   1 year ago Peripheral edema   [place] [facility] [place], Salvadore Oxford, NP   1 year ago Urinary tract infection with hematuria, site unspecified   [place] [place], [place], NP              Passed - Last BP in normal range    BP Readings from Last 1 Encounters:  01/21/23 114/65

## 2023-06-13 ENCOUNTER — Ambulatory Visit: Payer: Self-pay | Admitting: Internal Medicine

## 2023-06-16 ENCOUNTER — Ambulatory Visit (INDEPENDENT_AMBULATORY_CARE_PROVIDER_SITE_OTHER): Payer: Self-pay | Admitting: Internal Medicine

## 2023-06-16 ENCOUNTER — Encounter: Payer: Self-pay | Admitting: Internal Medicine

## 2023-06-16 VITALS — BP 130/68 | HR 73 | Ht 65.0 in | Wt 138.0 lb

## 2023-06-16 DIAGNOSIS — H8110 Benign paroxysmal vertigo, unspecified ear: Secondary | ICD-10-CM | POA: Insufficient documentation

## 2023-06-16 DIAGNOSIS — E7800 Pure hypercholesterolemia, unspecified: Secondary | ICD-10-CM

## 2023-06-16 DIAGNOSIS — H8113 Benign paroxysmal vertigo, bilateral: Secondary | ICD-10-CM

## 2023-06-16 DIAGNOSIS — F411 Generalized anxiety disorder: Secondary | ICD-10-CM

## 2023-06-16 MED ORDER — MECLIZINE HCL 12.5 MG PO TABS: 12.5000 mg | ORAL_TABLET | Freq: Three times a day (TID) | ORAL | 2 refills | Status: DC | PRN

## 2023-06-16 NOTE — Assessment & Plan Note (Signed)
C-Met and lipid profile today Encouraged her to consume a low-fat diet

## 2023-06-16 NOTE — Assessment & Plan Note (Signed)
Continue meclizine as needed, refilled today

## 2023-06-16 NOTE — Progress Notes (Signed)
Subjective:    Patient ID: Gina Gill, female    DOB: September 18, 1960, 62 y.o.   MRN: [id number]  HPI  Patient presents to clinic today for follow-up chronic conditions.  Anxiety: Chronic, managed on bupropion.  She is not currently seeing a therapist.  She denies depression, SI/HI.  BPPV: Managed with meclizine as needed. She does not follow with ENT or neurology.   Review of Systems     Past Medical History:  Diagnosis Date   Osteopenia     Current Outpatient Medications  Medication Sig Dispense Refill   buPROPion (WELLBUTRIN XL) 150 MG 24 hr tablet TAKE 1 TABLET BY MOUTH EVERY DAY 90 tablet 0   fexofenadine (ALLEGRA) 180 MG tablet Take 180 mg by mouth daily.     meclizine (ANTIVERT) 12.5 MG tablet Take 1 tablet (12.5 mg total) by mouth 3 (three) times daily as needed for dizziness. 30 tablet 0   Multiple Vitamin (MULTIVITAMIN) tablet Take 1 tablet by mouth daily.     No current facility-administered medications for this visit.    No Known Allergies  Family History  Problem Relation Age of Onset   Brain cancer Mother    Stroke Father    Heart disease Sister    Diabetes Sister    Heart disease Brother    Heart disease Brother     Social History   Socioeconomic History   Marital status: Married    Spouse name: Not on file   Number of children: Not on file   Years of education: Not on file   Highest education level: Not on file  Occupational History   Not on file  Tobacco Use   Smoking status: Former    Current packs/day: 0.00    Average packs/day: 0.5 packs/day for 30.0 years (15.0 ttl pk-yrs)    Types: Cigarettes    Start date: 10/15/1991    Quit date: 10/14/2021    Years since quitting: 1.6   Smokeless tobacco: Never  Vaping Use   Vaping status: Never Used  Substance and Sexual Activity   Alcohol use: Not Currently   Drug use: Never   Sexual activity: Not on file  Other Topics Concern   Not on file  Social History Narrative   Not on file    Social Determinants of Health   Financial Resource Strain: Not on file  Food Insecurity: Not on file  Transportation Needs: Not on file  Physical Activity: Not on file  Stress: Not on file  Social Connections: Not on file  Intimate Partner Violence: Not on file     Constitutional: Denies fever, malaise, fatigue, headache or abrupt weight changes.  HEENT: Denies eye pain, eye redness, ear pain, ringing in the ears, wax buildup, runny nose, nasal congestion, bloody nose, or sore throat. Respiratory: Denies difficulty breathing, shortness of breath, cough or sputum production.   Cardiovascular: Denies chest pain, chest tightness, palpitations or swelling in the hands or feet.  Gastrointestinal: Denies abdominal pain, bloating, constipation, diarrhea or blood in the stool.  GU: Denies urgency, frequency, pain with urination, burning sensation, blood in urine, odor or discharge. Musculoskeletal: Denies decrease in range of motion, difficulty with gait, muscle pain or joint pain and swelling.  Skin: Denies redness, rashes, lesions or ulcercations.  Neurological: Patient reports intermittent dizziness. Denies difficulty with memory, difficulty with speech or problems with balance and coordination.  Psych: Patient has a history of anxiety.  Denies depression, SI/HI.  No other specific complaints in a complete  review of systems (except as listed in HPI above).  Objective:   Physical Exam  BP 130/68   Pulse 73   Ht 5\' 5"  (1.651 m)   Wt 138 lb (62.6 kg)   SpO2 95%   BMI 22.96 kg/m   Wt Readings from Last 3 Encounters:  01/21/23 137 lb 11.2 oz (62.5 kg)  11/11/22 136 lb (61.7 kg)  05/31/22 131 lb (59.4 kg)    General: Appears her stated age, well developed, well nourished in NAD. Skin: Warm, dry and intact.  HEENT: Head: normal shape and size; Eyes: sclera white, no icterus, conjunctiva pink, PERRLA and EOMs intact;  Neck:  Neck supple, trachea midline. No masses, lumps or  thyromegaly present.  Cardiovascular: Normal rate and rhythm. S1,S2 noted.  No murmur, rubs or gallops noted. No JVD or BLE edema. No carotid bruits noted. Pulmonary/Chest: Normal effort and positive vesicular breath sounds. No respiratory distress. No wheezes, rales or ronchi noted.  Abdomen: Soft and nontender. Normal bowel sounds.  Musculoskeletal: Strength 5/5 BUE/BLE. No difficulty with gait.  Neurological: Alert and oriented. Cranial nerves II-XII grossly intact. Coordination normal.  Psychiatric: Mood and affect normal. Behavior is normal. Judgment and thought content normal.    BMET    Component Value Date/Time   NA 137 05/31/2022 1542   K 3.8 05/31/2022 1542   CL 99 05/31/2022 1542   CO2 27 05/31/2022 1542   GLUCOSE 93 05/31/2022 1542   BUN 11 05/31/2022 1542   CREATININE 0.81 05/31/2022 1542   CALCIUM 9.7 05/31/2022 1542    Lipid Panel     Component Value Date/Time   CHOL 201 (H) 09/08/2022 1023   TRIG 86 09/08/2022 1023   HDL 77 09/08/2022 1023   CHOLHDL 2.6 09/08/2022 1023   LDLCALC 106 (H) 09/08/2022 1023    CBC    Component Value Date/Time   WBC 6.2 05/31/2022 1542   RBC 4.13 05/31/2022 1542   HGB 13.0 05/31/2022 1542   HCT 37.7 05/31/2022 1542   PLT 270 05/31/2022 1542   MCV 91.3 05/31/2022 1542   MCH 31.5 05/31/2022 1542   MCHC 34.5 05/31/2022 1542   RDW 11.8 05/31/2022 1542    Hgb A1C No results found for: "HGBA1C"         Assessment & Plan:     RTC 1 year, sooner if needed Nicki Reaper, NP

## 2023-06-16 NOTE — Assessment & Plan Note (Signed)
Continue buproprion Support offered

## 2023-06-16 NOTE — Patient Instructions (Signed)

## 2023-06-17 ENCOUNTER — Other Ambulatory Visit: Payer: Self-pay

## 2023-06-17 LAB — LIPID PANEL
Cholesterol: 238 mg/dL — ABNORMAL HIGH (ref <200)
HDL: 73 mg/dL (ref >=50)
LDL Cholesterol (Calc): 129 mg/dL — ABNORMAL HIGH
Non-HDL Cholesterol (Calc): 165 mg/dL — ABNORMAL HIGH (ref <130)
Total CHOL/HDL Ratio: 3.3 (calc) (ref <5.0)
Triglycerides: 221 mg/dL — ABNORMAL HIGH (ref <150)

## 2023-06-17 LAB — COMPLETE METABOLIC PANEL WITH GFR
AG Ratio: 2.1 (calc) (ref 1.0–2.5)
ALT: 10 U/L (ref 6–29)
AST: 12 U/L (ref 10–35)
Albumin: 4.5 g/dL (ref 3.6–5.1)
Alkaline phosphatase (APISO): 89 U/L (ref 37–153)
BUN: 11 mg/dL (ref 7–25)
CO2: 27 mmol/L (ref 20–32)
Calcium: 9.2 mg/dL (ref 8.6–10.4)
Chloride: 102 mmol/L (ref 98–110)
Creat: 0.69 mg/dL (ref 0.50–1.05)
Globulin: 2.1 g/dL (ref 1.9–3.7)
Glucose, Bld: 94 mg/dL (ref 65–99)
Potassium: 4.1 mmol/L (ref 3.5–5.3)
Sodium: 138 mmol/L (ref 135–146)
Total Bilirubin: 0.3 mg/dL (ref 0.2–1.2)
Total Protein: 6.6 g/dL (ref 6.1–8.1)
eGFR: 98 mL/min/{1.73_m2} (ref >=60)

## 2023-06-17 MED ORDER — ATORVASTATIN CALCIUM 10 MG PO TABS: 10.0000 mg | ORAL_TABLET | Freq: Every day | ORAL | 1 refills | Status: DC

## 2023-06-17 MED ORDER — BUPROPION HCL ER (XL) 150 MG PO TB24: 150.0000 mg | ORAL_TABLET | Freq: Every day | ORAL | 1 refills | Status: DC

## 2023-06-17 NOTE — Addendum Note (Signed)
Addended by: Lorre Munroe on: 06/17/2023 11:07 AM   Modules accepted: Orders

## 2023-12-17 ENCOUNTER — Other Ambulatory Visit: Payer: Self-pay | Admitting: Internal Medicine

## 2023-12-19 NOTE — Telephone Encounter (Signed)
 Requested Prescriptions  Pending Prescriptions Disp Refills   buPROPion (WELLBUTRIN XL) 150 MG 24 hr tablet [Pharmacy Med Name: BUPROPION HCL XL 150 MG TABLET] 90 tablet 1    Sig: TAKE 1 TABLET BY MOUTH EVERY DAY     Psychiatry: Antidepressants - bupropion Failed - 12/19/2023 11:15 AM      Failed - Valid encounter within last 6 months    Recent Outpatient Visits   None     Future Appointments             In 3 months Gina Gill, Gina Oxford, NP [place] [facility]            Passed - Cr in normal range and within 360 days    Creat  Date Value Ref Range Status  06/16/2023 0.69 0.50 - 1.05 mg/dL Final         Passed - AST in normal range and within 360 days    AST  Date Value Ref Range Status  06/16/2023 12 10 - 35 U/L Final         Passed - ALT in normal range and within 360 days    ALT  Date Value Ref Range Status  06/16/2023 10 6 - 29 U/L Final         Passed - Last BP in normal range    BP Readings from Last 1 Encounters:  06/16/23 130/68

## 2024-02-02 ENCOUNTER — Ambulatory Visit: Payer: Self-pay

## 2024-02-02 NOTE — Telephone Encounter (Signed)
 Copied from CRM [id number]. Topic: Clinical - Red Word Triage >> Feb 02, 2024  9:37 AM El Gravely T wrote: [facility] that prompted transfer to Nurse Triage: Burning with urination   Chief Complaint: Burning with urination   Symptoms: Burning with urination, increased urinary frequency, decreased urinary output  Frequency: Every urination  Pertinent Negatives: Patient denies fever Disposition: [] ED /[] Urgent Care (no appt availability in office) / [x] Appointment(In office/virtual)/ []  [facility]/ [] Home Care/ [] Refused Recommended Disposition /[]  Mobile Bus/ []  Follow-up with PCP Additional Notes: Patient reports she has been experiencing burning with urination, increased urinary frequency, decreased urinary output for the last 2-3 days. She denies any fevers with her symptoms. Appointment made for tomorrow for evaluation.    Reason for Disposition  Age > 50 years  Answer Assessment - Initial Assessment Questions 1. SEVERITY: "How bad is the pain?"  (e.g., Scale 1-10; mild, moderate, or severe)   - MILD (1-3): complains slightly about urination hurting   - MODERATE (4-7): interferes with normal activities     - SEVERE (8-10): excruciating, unwilling or unable to urinate because of the pain      6/10 2. FREQUENCY: "How many times have you had painful urination today?"      3 times today  3. PATTERN: "Is pain present every time you urinate or just sometimes?"      Every time  4. ONSET: "When did the painful urination start?"      3 days ago  5. FEVER: "Do you have a fever?" If Yes, ask: "What is your temperature, how was it measured, and when did it start?"     No 6. PAST UTI: "Have you had a urine infection before?" If Yes, ask: "When was the last time?" and "What happened that time?"      Yes 7. CAUSE: "What do you think is causing the painful urination?"  (e.g., UTI, scratch, Herpes sore)     UTI 8. OTHER SYMPTOMS: "Do you have any other symptoms?" (e.g., blood in  urine, flank pain, genital sores, urgency, vaginal discharge)     Increased urinary frequency with decreased urinary output  Protocols used: Urination Pain - Female-A-AH

## 2024-02-03 ENCOUNTER — Ambulatory Visit: Payer: Self-pay | Admitting: Internal Medicine

## 2024-02-03 ENCOUNTER — Encounter: Payer: Self-pay | Admitting: Internal Medicine

## 2024-02-03 VITALS — BP 126/74 | Ht 65.0 in | Wt 134.8 lb

## 2024-02-03 DIAGNOSIS — R8290 Unspecified abnormal findings in urine: Secondary | ICD-10-CM

## 2024-02-03 DIAGNOSIS — R300 Dysuria: Secondary | ICD-10-CM

## 2024-02-03 DIAGNOSIS — R350 Frequency of micturition: Secondary | ICD-10-CM

## 2024-02-03 DIAGNOSIS — R3989 Other symptoms and signs involving the genitourinary system: Secondary | ICD-10-CM

## 2024-02-03 LAB — POCT URINE DIPSTICK
Bilirubin, UA: NEGATIVE
Blood, UA: NEGATIVE
Glucose, UA: NEGATIVE mg/dL
Ketones, POC UA: NEGATIVE mg/dL
Nitrite, UA: NEGATIVE
POC PROTEIN,UA: NEGATIVE
Spec Grav, UA: <=1.005 — AB (ref 1.010–1.025)
Urobilinogen, UA: 0.2 U/dL
pH, UA: 6.5 (ref 5.0–8.0)

## 2024-02-03 MED ORDER — ATORVASTATIN CALCIUM 10 MG PO TABS: 10.0000 mg | ORAL_TABLET | Freq: Every day | ORAL | 1 refills | Status: DC

## 2024-02-03 MED ORDER — MECLIZINE HCL 12.5 MG PO TABS: 12.5000 mg | ORAL_TABLET | Freq: Three times a day (TID) | ORAL | 2 refills | Status: DC | PRN

## 2024-02-03 MED ORDER — NITROFURANTOIN MONOHYD MACRO 100 MG PO CAPS: 100.0000 mg | ORAL_CAPSULE | Freq: Two times a day (BID) | ORAL | 0 refills | Status: DC

## 2024-02-03 NOTE — Patient Instructions (Signed)
 Dysuria Dysuria is pain or discomfort when you pee. The pain may be felt in your urethra, which is the part of your body that drains pee (urine) from your bladder. The pain may also be felt near your genitals, groin, or in your lower belly or back. You may have to pee often or have the sudden feeling that you need to pee. This condition can affect anyone, but it's more common in females. It can be caused by: A urinary tract infection (UTI). Kidney stones or bladder stones. Some sexually transmitted infections (STIs). Dehydration. This is when there's not enough water in your body. Irritation and swelling in the vagina. The use of some medicines. The use of some soaps or products with a scent. Follow these instructions at home: Medicines  Take your medicines only as told. Take your antibiotics as told. Do not stop taking them even if you start to feel better. Eating and drinking Drink enough fluid to keep your pee pale yellow. Certain drinks can make the pain worse. Avoid: Drinks with caffeine in them. Tea. Alcohol. In males, alcohol may irritate the prostate. General instructions Watch your condition for any changes, such as color changes in your pee. Pee often. Do not hold your pee for a long time. If you're female, wipe from front to back after you pee or poop. Use each tissue only once when you wipe. Pee after you have sex. If you've had any tests done, it's up to you to get your test results. Ask your health care provider, or the department doing the test, when your results will be ready. Contact a health care provider if: You have a fever or chills. You have pain in your back or sides. You throw up or feel like you may throw up. You have blood in your pee. You're not peeing as often as normal. You feel very weak. Get help right away if: You have very bad pain that doesn't get better with medicine. You're confused. You have a fast heartbeat while resting. This information is  not intended to replace advice given to you by your health care provider. Make sure you discuss any questions you have with your health care provider. Document Revised: 01/04/2023 Document Reviewed: 01/04/2023 Elsevier Patient Education  2024 [employer].

## 2024-02-03 NOTE — Progress Notes (Signed)
 Subjective:    Patient ID: Gina Gill, female    DOB: 02-13-1961, 63 y.o.   MRN: [id number]  HPI  Discussed the use of AI scribe software for clinical note transcription with the patient, who gave verbal consent to proceed.  Gina Gill is a 63 year old female who presents with symptoms suggestive of a urinary or kidney infection.  She has been experiencing symptoms for about three to four days, including urinary urgency, dysuria, and dull pain in the lower abdomen and back, which she associates with kidney pain. No hematuria, vaginal symptoms, fever, chills, or vomiting. However, she feels nauseated, particularly when the pain medication wears off, and notes that the pain and pressure seem to contribute to this sensation.  She has been taking ibuprofen to manage the pain, which provides relief, but the symptoms return once the medication wears off. She also notes an unusual odor in her urine, which is not typical for her.  She is seeking refills for her cholesterol medication and meclizine .       Review of Systems   Past Medical History:  Diagnosis Date   Osteopenia     Current Outpatient Medications  Medication Sig Dispense Refill   atorvastatin  (LIPITOR) 10 MG tablet Take 1 tablet (10 mg total) by mouth daily. 90 tablet 1   buPROPion  (WELLBUTRIN  XL) 150 MG 24 hr tablet TAKE 1 TABLET BY MOUTH EVERY DAY 90 tablet 1   fexofenadine (ALLEGRA) 180 MG tablet Take 180 mg by mouth daily.     meclizine  (ANTIVERT ) 12.5 MG tablet Take 1 tablet (12.5 mg total) by mouth 3 (three) times daily as needed for dizziness. 30 tablet 2   Multiple Vitamin (MULTIVITAMIN) tablet Take 1 tablet by mouth daily.     No current facility-administered medications for this visit.    No Known Allergies  Family History  Problem Relation Age of Onset   Brain cancer Mother    Stroke Father    Heart disease Sister    Diabetes Sister    Heart disease Brother    Heart disease Brother      Social History   Socioeconomic History   Marital status: Married    Spouse name: Not on file   Number of children: Not on file   Years of education: Not on file   Highest education level: Not on file  Occupational History   Not on file  Tobacco Use   Smoking status: Former    Current packs/day: 0.00    Average packs/day: 0.5 packs/day for 30.0 years (15.0 ttl pk-yrs)    Types: Cigarettes    Start date: 10/15/1991    Quit date: 10/14/2021    Years since quitting: 2.3   Smokeless tobacco: Never  Vaping Use   Vaping status: Never Used  Substance and Sexual Activity   Alcohol use: Not Currently   Drug use: Never   Sexual activity: Not on file  Other Topics Concern   Not on file  Social History Narrative   Not on file   Social Drivers of Health   Financial Resource Strain: Not on file  Food Insecurity: Not on file  Transportation Needs: Not on file  Physical Activity: Not on file  Stress: Not on file  Social Connections: Not on file  Intimate Partner Violence: Not on file     Constitutional: Denies fever, malaise, fatigue, headache or abrupt weight changes.  Respiratory: Denies difficulty breathing, shortness of breath, cough or sputum production.  Cardiovascular: Denies chest pain, chest tightness, palpitations or swelling in the hands or feet.  Gastrointestinal: Pt reports bladder pressure, nausea. Denies abdominal pain, bloating, constipation, diarrhea or blood in the stool.  GU: Pt reports urinary frequency, dysuria and urine odor. Denies urgency, burning sensation, blood in urine, or discharge.  No other specific complaints in a complete review of systems (except as listed in HPI above).      Objective:   Physical Exam  BP 126/74 (BP Location: Left Arm, Patient Position: Sitting, Cuff Size: Normal)   Ht 5\' 5"  (1.651 m)   Wt 134 lb 12.8 oz (61.1 kg)   BMI 22.43 kg/m  Wt Readings from Last 3 Encounters:  02/03/24 134 lb 12.8 oz (61.1 kg)  06/16/23 138 lb  (62.6 kg)  01/21/23 137 lb 11.2 oz (62.5 kg)    General: Appears her stated age, well developed, well nourished in NAD. Cardiovascular: Normal rate and rhythm.  Pulmonary/Chest: Normal effort and positive vesicular breath sounds. No respiratory distress. No wheezes, rales or ronchi noted.  Abdomen: Soft and mildly tender over the bladder. No CVA tenderness noted.  Musculoskeletal: No difficulty with gait.  Neurological: Alert and oriented.   BMET    Component Value Date/Time   NA 138 06/16/2023 1448   K 4.1 06/16/2023 1448   CL 102 06/16/2023 1448   CO2 27 06/16/2023 1448   GLUCOSE 94 06/16/2023 1448   BUN 11 06/16/2023 1448   CREATININE 0.69 06/16/2023 1448   CALCIUM  9.2 06/16/2023 1448    Lipid Panel     Component Value Date/Time   CHOL 238 (H) 06/16/2023 1448   TRIG 221 (H) 06/16/2023 1448   HDL 73 06/16/2023 1448   CHOLHDL 3.3 06/16/2023 1448   LDLCALC 129 (H) 06/16/2023 1448    CBC    Component Value Date/Time   WBC 6.2 05/31/2022 1542   RBC 4.13 05/31/2022 1542   HGB 13.0 05/31/2022 1542   HCT 37.7 05/31/2022 1542   PLT 270 05/31/2022 1542   MCV 91.3 05/31/2022 1542   MCH 31.5 05/31/2022 1542   MCHC 34.5 05/31/2022 1542   RDW 11.8 05/31/2022 1542    Hgb A1C No results found for: "HGBA1C"          Assessment & Plan:    Assessment and Plan    Urinary frequency, dysuria, odor and bladder pressure Empirical antibiotic treatment initiated due to symptom duration and holiday weekend. Differential includes UTI versus other causes of dysuria and abdominal pain. - Prescribed Macrobid 100 mg twice daily for 5 days. - Sent urine for culture. - Advised increased fluid intake. - Recommended AZO for dysuria, warned of orange urine discoloration. - Follow up with urine culture results on Tuesday.        RTC in 2 months for followup chronic conditions Helayne Lo, NP

## 2024-02-05 LAB — URINE CULTURE
MICRO NUMBER:: 16495853
SPECIMEN QUALITY:: ADEQUATE

## 2024-02-07 ENCOUNTER — Ambulatory Visit: Payer: Self-pay | Admitting: Internal Medicine

## 2024-02-07 MED ORDER — SULFAMETHOXAZOLE-TRIMETHOPRIM 800-160 MG PO TABS: 1.0000 | ORAL_TABLET | Freq: Two times a day (BID) | ORAL | 0 refills | Status: AC

## 2024-03-15 ENCOUNTER — Ambulatory Visit: Payer: Self-pay | Admitting: Internal Medicine

## 2024-03-15 NOTE — Progress Notes (Deleted)
 Subjective:    Patient ID: Gina Gill, female    DOB: 1960/09/28, 63 y.o.   MRN: [id number]  HPI  Patient presents to clinic today for follow-up chronic conditions.  Anxiety: Chronic, managed on bupropion .  She is not currently seeing a therapist.  She denies depression, SI/HI.  BPPV: Managed with meclizine  as needed. She does not follow with ENT or neurology.  HLD: Her last LDL was 129, triglycerides 221, 06/2023.  She denies myalgias on atorvastatin .  She does not consume low-fat diet.   Review of Systems     Past Medical History:  Diagnosis Date   Osteopenia     Current Outpatient Medications  Medication Sig Dispense Refill   atorvastatin  (LIPITOR) 10 MG tablet Take 1 tablet (10 mg total) by mouth daily. 90 tablet 1   buPROPion  (WELLBUTRIN  XL) 150 MG 24 hr tablet TAKE 1 TABLET BY MOUTH EVERY DAY 90 tablet 1   fexofenadine (ALLEGRA) 180 MG tablet Take 180 mg by mouth daily.     meclizine  (ANTIVERT ) 12.5 MG tablet Take 1 tablet (12.5 mg total) by mouth 3 (three) times daily as needed for dizziness. 30 tablet 2   Multiple Vitamin (MULTIVITAMIN) tablet Take 1 tablet by mouth daily.     nitrofurantoin , macrocrystal-monohydrate, (MACROBID ) 100 MG capsule Take 1 capsule (100 mg total) by mouth 2 (two) times daily. 10 capsule 0   No current facility-administered medications for this visit.    No Known Allergies  Family History  Problem Relation Age of Onset   Brain cancer Mother    Stroke Father    Heart disease Sister    Diabetes Sister    Heart disease Brother    Heart disease Brother     Social History   Socioeconomic History   Marital status: Married    Spouse name: Not on file   Number of children: Not on file   Years of education: Not on file   Highest education level: Not on file  Occupational History   Not on file  Tobacco Use   Smoking status: Former    Current packs/day: 0.00    Average packs/day: 0.5 packs/day for 30.0 years (15.0 ttl pk-yrs)     Types: Cigarettes    Start date: 10/15/1991    Quit date: 10/14/2021    Years since quitting: 2.4   Smokeless tobacco: Never  Vaping Use   Vaping status: Never Used  Substance and Sexual Activity   Alcohol use: Not Currently   Drug use: Never   Sexual activity: Not on file  Other Topics Concern   Not on file  Social History Narrative   Not on file   Social Drivers of Health   Financial Resource Strain: Not on file  Food Insecurity: Not on file  Transportation Needs: Not on file  Physical Activity: Not on file  Stress: Not on file  Social Connections: Not on file  Intimate Partner Violence: Not on file     Constitutional: Denies fever, malaise, fatigue, headache or abrupt weight changes.  HEENT: Denies eye pain, eye redness, ear pain, ringing in the ears, wax buildup, runny nose, nasal congestion, bloody nose, or sore throat. Respiratory: Denies difficulty breathing, shortness of breath, cough or sputum production.   Cardiovascular: Denies chest pain, chest tightness, palpitations or swelling in the hands or feet.  Gastrointestinal: Denies abdominal pain, bloating, constipation, diarrhea or blood in the stool.  GU: Denies urgency, frequency, pain with urination, burning sensation, blood in urine, odor or  discharge. Musculoskeletal: Denies decrease in range of motion, difficulty with gait, muscle pain or joint pain and swelling.  Skin: Denies redness, rashes, lesions or ulcercations.  Neurological: Patient reports intermittent dizziness. Denies difficulty with memory, difficulty with speech or problems with balance and coordination.  Psych: Patient has a history of anxiety.  Denies depression, SI/HI.  No other specific complaints in a complete review of systems (except as listed in HPI above).  Objective:   Physical Exam  There were no vitals taken for this visit.  Wt Readings from Last 3 Encounters:  02/03/24 134 lb 12.8 oz (61.1 kg)  06/16/23 138 lb (62.6 kg)   01/21/23 137 lb 11.2 oz (62.5 kg)    General: Appears her stated age, well developed, well nourished in NAD. Skin: Warm, dry and intact.  HEENT: Head: normal shape and size; Eyes: sclera white, no icterus, conjunctiva pink, PERRLA and EOMs intact;  Neck:  Neck supple, trachea midline. No masses, lumps or thyromegaly present.  Cardiovascular: Normal rate and rhythm. S1,S2 noted.  No murmur, rubs or gallops noted. No JVD or BLE edema. No carotid bruits noted. Pulmonary/Chest: Normal effort and positive vesicular breath sounds. No respiratory distress. No wheezes, rales or ronchi noted.  Abdomen: Soft and nontender. Normal bowel sounds.  Musculoskeletal: Strength 5/5 BUE/BLE. No difficulty with gait.  Neurological: Alert and oriented. Cranial nerves II-XII grossly intact. Coordination normal.  Psychiatric: Mood and affect normal. Behavior is normal. Judgment and thought content normal.    BMET    Component Value Date/Time   NA 138 06/16/2023 1448   K 4.1 06/16/2023 1448   CL 102 06/16/2023 1448   CO2 27 06/16/2023 1448   GLUCOSE 94 06/16/2023 1448   BUN 11 06/16/2023 1448   CREATININE 0.69 06/16/2023 1448   CALCIUM  9.2 06/16/2023 1448    Lipid Panel     Component Value Date/Time   CHOL 238 (H) 06/16/2023 1448   TRIG 221 (H) 06/16/2023 1448   HDL 73 06/16/2023 1448   CHOLHDL 3.3 06/16/2023 1448   LDLCALC 129 (H) 06/16/2023 1448    CBC    Component Value Date/Time   WBC 6.2 05/31/2022 1542   RBC 4.13 05/31/2022 1542   HGB 13.0 05/31/2022 1542   HCT 37.7 05/31/2022 1542   PLT 270 05/31/2022 1542   MCV 91.3 05/31/2022 1542   MCH 31.5 05/31/2022 1542   MCHC 34.5 05/31/2022 1542   RDW 11.8 05/31/2022 1542    Hgb A1C No results found for: HGBA1C         Assessment & Plan:     RTC in 6 months for your annual exam Angeline Laura, NP

## 2024-03-19 ENCOUNTER — Ambulatory Visit: Payer: Self-pay | Admitting: Internal Medicine

## 2024-06-20 ENCOUNTER — Other Ambulatory Visit: Payer: Self-pay | Admitting: Internal Medicine

## 2024-06-21 NOTE — Telephone Encounter (Signed)
 Requested Prescriptions  Pending Prescriptions Disp Refills   buPROPion  (WELLBUTRIN  XL) 150 MG 24 hr tablet [Pharmacy Med Name: BUPROPION  HCL XL 150 MG TABLET] 90 tablet 0    Sig: TAKE 1 TABLET BY MOUTH EVERY DAY     Psychiatry: Antidepressants - bupropion  Failed - 06/21/2024  4:02 PM      Failed - Cr in normal range and within 360 days    Creat  Date Value Ref Range Status  06/16/2023 0.69 0.50 - 1.05 mg/dL Final         Failed - AST in normal range and within 360 days    AST  Date Value Ref Range Status  06/16/2023 12 10 - 35 U/L Final         Failed - ALT in normal range and within 360 days    ALT  Date Value Ref Range Status  06/16/2023 10 6 - 29 U/L Final         Failed - Valid encounter within last 6 months    Recent Outpatient Visits           4 months ago Dysuria   [place] [facility] [place], Angeline ORN, NP              Passed - Last BP in normal range    BP Readings from Last 1 Encounters:  02/03/24 126/74

## 2024-07-02 ENCOUNTER — Encounter: Payer: Self-pay | Admitting: Internal Medicine

## 2024-07-02 ENCOUNTER — Ambulatory Visit (INDEPENDENT_AMBULATORY_CARE_PROVIDER_SITE_OTHER): Payer: Self-pay | Admitting: Internal Medicine

## 2024-07-02 VITALS — BP 132/70 | HR 87 | Temp 98.4°F | Ht 65.0 in | Wt 133.4 lb

## 2024-07-02 DIAGNOSIS — J00 Acute nasopharyngitis [common cold]: Secondary | ICD-10-CM

## 2024-07-02 DIAGNOSIS — J3089 Other allergic rhinitis: Secondary | ICD-10-CM

## 2024-07-02 MED ORDER — AZITHROMYCIN 250 MG PO TABS: ORAL_TABLET | ORAL | 0 refills | Status: DC

## 2024-07-02 MED ORDER — FLUTICASONE PROPIONATE 50 MCG/ACT NA SUSP
2.0000 | Freq: Every day | NASAL | 3 refills | Status: AC
Start: 2024-07-02 — End: ?

## 2024-07-02 NOTE — Progress Notes (Signed)
 Subjective:    Patient ID: Gina Gill, female    DOB: [birth year], 63 y.o.   MRN: [id number]  HPI  Discussed the use of AI scribe software for clinical note transcription with the patient, who gave verbal consent to proceed.  Gina Gill is a 63 year old female who presents with worsening upper respiratory symptoms over the past five days.  She has experienced upper respiratory symptoms for five days, with worsening over the last three days. Initially mild, the symptoms became more pronounced by 08-09-2024. She describes headache, sinus pressure, rhinorrhea, nasal congestion, and a productive cough with green sputum. No otalgia or significant pharyngitis, though she notes a sore throat from coughing.  She reports a slight fever of approximately 99 degrees. No chills, myalgias, dyspnea, nausea, vomiting, or diarrhea. She has not been in contact with anyone exhibiting similar symptoms. She mentions feeling better than she did over the weekend, particularly on Saturday and Sunday.  For symptom relief, she has been taking Mucinex, which she feels has helped keep her symptoms 'loose'. She also takes Flonase and Allegra for her seasonal allergies, which she experiences around this time of year. She denies smoking and has no history of asthma or COPD.       Review of Systems     Past Medical History:  Diagnosis Date   Osteopenia     Current Outpatient Medications  Medication Sig Dispense Refill   atorvastatin  (LIPITOR) 10 MG tablet Take 1 tablet (10 mg total) by mouth daily. 90 tablet 1   buPROPion  (WELLBUTRIN  XL) 150 MG 24 hr tablet TAKE 1 TABLET BY MOUTH EVERY DAY 90 tablet 0   fexofenadine (ALLEGRA) 180 MG tablet Take 180 mg by mouth daily.     meclizine  (ANTIVERT ) 12.5 MG tablet Take 1 tablet (12.5 mg total) by mouth 3 (three) times daily as needed for dizziness. 30 tablet 2   Multiple Vitamin (MULTIVITAMIN) tablet Take 1 tablet by mouth daily.     nitrofurantoin ,  macrocrystal-monohydrate, (MACROBID ) 100 MG capsule Take 1 capsule (100 mg total) by mouth 2 (two) times daily. 10 capsule 0   No current facility-administered medications for this visit.    No Known Allergies  Family History  Problem Relation Age of Onset   Brain cancer Mother    Stroke Father    Heart disease Sister    Diabetes Sister    Heart disease Brother    Heart disease Brother     Social History   Socioeconomic History   Marital status: Married    Spouse name: Not on file   Number of children: Not on file   Years of education: Not on file   Highest education level: Not on file  Occupational History   Not on file  Tobacco Use   Smoking status: Former    Current packs/day: 0.00    Average packs/day: 0.5 packs/day for 30.0 years (15.0 ttl pk-yrs)    Types: Cigarettes    Start date: 10/15/1991    Quit date: 10/14/2021    Years since quitting: 2.7   Smokeless tobacco: Never  Vaping Use   Vaping status: Never Used  Substance and Sexual Activity   Alcohol use: Not Currently   Drug use: Never   Sexual activity: Not on file  Other Topics Concern   Not on file  Social History Narrative   Not on file   Social Drivers of Health   Financial Resource Strain: Not on file  Food Insecurity: Not  on file  Transportation Needs: Not on file  Physical Activity: Not on file  Stress: Not on file  Social Connections: Not on file  Intimate Partner Violence: Not on file     Constitutional: Pt reports headache. Denies fever, malaise, fatigue, or abrupt weight changes.  HEENT: Pt reports sinus pressure, runny nose, nasal congestion, and sore throat Denies eye pain, eye redness, ear pain, ringing in the ears, wax buildup, bloody nose. Respiratory: Pt reports cough. Denies difficulty breathing, shortness of breath.   Cardiovascular: Denies chest pain, chest tightness, palpitations or swelling in the hands or feet.  Gastrointestinal: Denies abdominal pain, bloating, constipation,  diarrhea or blood in the stool.  Musculoskeletal: Denies decrease in range of motion, difficulty with gait, muscle pain or joint pain and swelling.  Skin: Denies redness, rashes, lesions or ulcercations.  Neurological: Denies dizziness, difficulty with memory, difficulty with speech or problems with balance and coordination.   No other specific complaints in a complete review of systems (except as listed in HPI above).  Objective:   Physical Exam Blood pressure 132/70, pulse 87, temperature 98.4 F (36.9 C), height 5' 5 (1.651 m), weight 133 lb 6.4 oz (60.5 kg), SpO2 93%.   Wt Readings from Last 3 Encounters:  02/03/24 134 lb 12.8 oz (61.1 kg)  06/16/23 138 lb (62.6 kg)  01/21/23 137 lb 11.2 oz (62.5 kg)    General: Appears her stated age, well developed, well nourished in NAD. Skin: Warm, dry and intact.  HEENT: Head: normal shape and size, right maxillary sinus pressure noted; Eyes: sclera white, no icterus, conjunctiva pink, PERRLA and EOMs intact; Nose: mucosa dry and moist, septum midline;Throat: mucosa pink and moist, + PND, no erythema, exudate, lesions or ulcerations noted. Neck: No adenopathy noted. Cardiovascular: Normal rate and rhythm. S1,S2 noted.  No murmur, rubs or gallops noted. No JVD or BLE edema. No carotid bruits noted. Pulmonary/Chest: Normal effort and positive vesicular breath sounds. No respiratory distress. No wheezes, rales or ronchi noted.  Neurological: Alert and oriented. Cranial nerves II-XII grossly intact. Coordination normal.    BMET    Component Value Date/Time   NA 138 06/16/2023 1448   K 4.1 06/16/2023 1448   CL 102 06/16/2023 1448   CO2 27 06/16/2023 1448   GLUCOSE 94 06/16/2023 1448   BUN 11 06/16/2023 1448   CREATININE 0.69 06/16/2023 1448   CALCIUM  9.2 06/16/2023 1448    Lipid Panel     Component Value Date/Time   CHOL 238 (H) 06/16/2023 1448   TRIG 221 (H) 06/16/2023 1448   HDL 73 06/16/2023 1448   CHOLHDL 3.3 06/16/2023 1448    LDLCALC 129 (H) 06/16/2023 1448    CBC    Component Value Date/Time   WBC 6.2 05/31/2022 1542   RBC 4.13 05/31/2022 1542   HGB 13.0 05/31/2022 1542   HCT 37.7 05/31/2022 1542   PLT 270 05/31/2022 1542   MCV 91.3 05/31/2022 1542   MCH 31.5 05/31/2022 1542   MCHC 34.5 05/31/2022 1542   RDW 11.8 05/31/2022 1542    Hgb A1C No results found for: HGBA1C         Assessment & Plan:   Assessment and Plan    Acute upper respiratory infection with allergic rhinitis Acute URI with cough, green sputum, rhinorrhea, nasal congestion, and low-grade fever, likely viral. Symptoms improving, exacerbated by allergic rhinitis. - Discontinue Mucinex. - Encourage rest and fluids - Take Allegra 180 mg twice daily for three days, then once daily. -  RX for Flonase nasal spray twice daily for three days. - Rx for Azithromycin 250 mg x 5 days     Schedule appointment for follow-up of chronic conditions Angeline Laura, NP

## 2024-07-02 NOTE — Patient Instructions (Signed)
 Allergic Rhinitis, Adult  Allergic rhinitis is a reaction to allergens. Allergens are things that can cause an allergic reaction. This condition affects the lining inside the nose (mucous membrane). There are two types of allergic rhinitis: Seasonal. This type is also called hay fever. It happens only during some times of the year. Perennial. This type can happen at any time of the year. This condition cannot be spread from person to person (is not contagious). It can be mild, bad, or very bad. It can develop at any age and may be outgrown. What are the causes? Pollen from grasses, trees, and weeds. Other causes can be: Dust mites. Smoke. Mold. Car fumes. The pee (urine), spit, or dander of pets. Dander is dead skin cells from a pet. What increases the risk? You are more likely to develop this condition if: You have allergies in your family. You have problems like allergies in your family. You may have: Swelling of parts of your eyes and eyelids. Asthma. This affects how you breathe. Long-term redness and swelling on your skin. Food allergies. What are the signs or symptoms? The main symptom of this condition is a runny or stuffy nose (nasal congestion). Other symptoms may include: Sneezing or coughing. Itching and tearing of your eyes. Mucus that drips down the back of your throat (postnasal drip). This may cause a sore throat. Trouble sleeping. Feeling tired. Headache. How is this treated? There is no cure for this condition. You should avoid things that you are allergic to. Treatment can help to relieve symptoms. This may include: Medicines that block allergy symptoms, such as anti-inflammatories or antihistamines. These may be given as a shot, nasal spray, or pill. Avoiding things you are allergic to. Medicines that give you some of what you are allergic to over time. This is called immunotherapy. It is done if other treatments do not help. You may get: Shots. Medicine under  your tongue. Stronger medicines, if other treatments do not help. Follow these instructions at home: Avoiding allergens Find out what things you are allergic to and avoid them. To do this, try these things: If you get allergies any time of year: Replace carpet with wood, tile, or vinyl flooring. Carpet can trap pet dander and dust. Do not smoke. Do not allow smoking in your home. Change your heating and air conditioning filters at least once a month. If you get allergies only some times of the year: Keep windows closed when you can. Plan things to do outside when pollen counts are lowest. Check pollen counts before you plan things to do outside. When you come indoors, change your clothes and shower before you sit on furniture or bedding. If you are allergic to a pet: Keep the pet out of your bedroom. Vacuum, sweep, and dust often. General instructions Take over-the-counter and prescription medicines only as told by your doctor. Drink enough fluid to keep your pee pale yellow. Where to find more information American Academy of Allergy, Asthma & Immunology: aaaai.org Contact a doctor if: You have a fever. You get a cough that does not go away. You make high-pitched whistling sounds when you breathe, most often when you breathe out (wheeze). Your symptoms slow you down. Your symptoms stop you from doing your normal things each day. Get help right away if: You are short of breath. This symptom may be an emergency. Get help right away. Call 911. Do not wait to see if the symptom will go away. Do not drive yourself to the  hospital. This information is not intended to replace advice given to you by your health care provider. Make sure you discuss any questions you have with your health care provider. Document Revised: 05/10/2022 Document Reviewed: 05/10/2022 Elsevier Patient Education  2024 [employer].

## 2024-08-27 ENCOUNTER — Encounter: Payer: Self-pay | Admitting: Internal Medicine

## 2024-08-27 NOTE — Progress Notes (Deleted)
 Subjective:    Patient ID: Gina Gill Para, female    DOB: [birth year], 63 y.o.   MRN: [id number]  HPI  Patient presents to clinic today for her annual exam.  Flu: never Tetanus: > 10 years ago COVID: never Shingrix: never Pap smear: Hysterectomy Mammogram: > 2 years ago Bone density: never Colon screening: never Vision screening: as needed Dentist: biannually  Diet: She does eat meat. She consumes fruits and veggies. She tries to avoid fried foods. She drinks mostly coffee, tea and water. Exercise: Gym  Review of Systems     Past Medical History:  Diagnosis Date   Osteopenia     Current Outpatient Medications  Medication Sig Dispense Refill   atorvastatin  (LIPITOR) 10 MG tablet Take 1 tablet (10 mg total) by mouth daily. (Patient not taking: Reported on 07/02/2024) 90 tablet 1   azithromycin  (ZITHROMAX ) 250 MG tablet Take 2 tabs today, then 1 tab daily x 4 days 6 tablet 0   buPROPion  (WELLBUTRIN  XL) 150 MG 24 hr tablet TAKE 1 TABLET BY MOUTH EVERY DAY 90 tablet 0   fexofenadine (ALLEGRA) 180 MG tablet Take 180 mg by mouth daily.     fluticasone  (FLONASE ) 50 MCG/ACT nasal spray Place 2 sprays into both nostrils daily. Use for 4-6 weeks then stop and use seasonally or as needed. 16 g 3   meclizine  (ANTIVERT ) 12.5 MG tablet Take 1 tablet (12.5 mg total) by mouth 3 (three) times daily as needed for dizziness. 30 tablet 2   Multiple Vitamin (MULTIVITAMIN) tablet Take 1 tablet by mouth daily.     No current facility-administered medications for this visit.    No Known Allergies  Family History  Problem Relation Age of Onset   Brain cancer Mother    Stroke Father    Heart disease Sister    Diabetes Sister    Heart disease Brother    Heart disease Brother     Social History   Socioeconomic History   Marital status: Married    Spouse name: Not on file   Number of children: Not on file   Years of education: Not on file   Highest education level: Not on file   Occupational History   Not on file  Tobacco Use   Smoking status: Former    Current packs/day: 0.00    Average packs/day: 0.5 packs/day for 30.0 years (15.0 ttl pk-yrs)    Types: Cigarettes    Start date: 10/15/1991    Quit date: 10/14/2021    Years since quitting: 2.8   Smokeless tobacco: Never  Vaping Use   Vaping status: Never Used  Substance and Sexual Activity   Alcohol use: Not Currently   Drug use: Never   Sexual activity: Not on file  Other Topics Concern   Not on file  Social History Narrative   Not on file   Social Drivers of Health   Tobacco Use: Medium Risk (07/02/2024)   Patient History    Smoking Tobacco Use: Former    Smokeless Tobacco Use: Never    Passive Exposure: Not on Actuary Strain: Not on file  Food Insecurity: Not on file  Transportation Needs: Not on file  Physical Activity: Not on file  Stress: Not on file  Social Connections: Not on file  Intimate Partner Violence: Not on file  Depression (PHQ2-9): Low Risk (07/02/2024)   Depression (PHQ2-9)    PHQ-2 Score: 0  Alcohol Screen: Low Risk (03/18/2022)   Alcohol Screen  Last Alcohol Screening Score (AUDIT): 1  Housing: Not on file  Utilities: Not on file  Health Literacy: Not on file     Constitutional: Denies fever, malaise, fatigue, headache or abrupt weight changes.  HEENT: Denies eye pain, eye redness, ear pain, ringing in the ears, wax buildup, runny nose, nasal congestion, bloody nose, or sore throat. Respiratory: Denies difficulty breathing, shortness of breath, cough or sputum production.   Cardiovascular: Denies chest pain, chest tightness, palpitations or swelling in the hands or feet.  Gastrointestinal: Denies abdominal pain, bloating, constipation, diarrhea or blood in the stool.  GU: Denies urgency, frequency, pain with urination, burning sensation, blood in urine, odor or discharge. Musculoskeletal: Denies decrease in range of motion, difficulty with gait, muscle  pain or joint pain and swelling.  Skin: Denies redness, rashes, lesions or ulcercations.  Neurological: Patient reports intermittent dizziness.  Denies dizziness, difficulty with memory, difficulty with speech or problems with balance and coordination.  Psych: Patient has a history of anxiety.  Denies depression, SI/HI.  No other specific complaints in a complete review of systems (except as listed in HPI above).  Objective:   Physical Exam  There were no vitals taken for this visit.  Wt Readings from Last 3 Encounters:  07/02/24 133 lb 6.4 oz (60.5 kg)  02/03/24 134 lb 12.8 oz (61.1 kg)  06/16/23 138 lb (62.6 kg)    General: Appears her stated age, well developed, well nourished in NAD. Skin: Warm, dry and intact.  HEENT: Head: normal shape and size; Eyes: sclera white, no icterus, conjunctiva pink, PERRLA and EOMs intact;  Neck:  Neck supple, trachea midline. No masses, lumps or thyromegaly present.  Cardiovascular: Normal rate and rhythm. S1,S2 noted.  No murmur, rubs or gallops noted. No JVD or BLE edema. No carotid bruits noted. Pulmonary/Chest: Normal effort and positive vesicular breath sounds. No respiratory distress. No wheezes, rales or ronchi noted.  Abdomen:  Normal bowel sounds.  Musculoskeletal: Strength 5/5 BUE/BLE. No difficulty with gait.  Neurological: Alert and oriented. Cranial nerves II-XII grossly intact. Coordination normal.  Psychiatric: Mood and affect normal. Behavior is normal. Judgment and thought content normal.    BMET    Component Value Date/Time   NA 138 06/16/2023 1448   K 4.1 06/16/2023 1448   CL 102 06/16/2023 1448   CO2 27 06/16/2023 1448   GLUCOSE 94 06/16/2023 1448   BUN 11 06/16/2023 1448   CREATININE 0.69 06/16/2023 1448   CALCIUM  9.2 06/16/2023 1448    Lipid Panel     Component Value Date/Time   CHOL 238 (H) 06/16/2023 1448   TRIG 221 (H) 06/16/2023 1448   HDL 73 06/16/2023 1448   CHOLHDL 3.3 06/16/2023 1448   LDLCALC 129 (H)  06/16/2023 1448    CBC    Component Value Date/Time   WBC 6.2 05/31/2022 1542   RBC 4.13 05/31/2022 1542   HGB 13.0 05/31/2022 1542   HCT 37.7 05/31/2022 1542   PLT 270 05/31/2022 1542   MCV 91.3 05/31/2022 1542   MCH 31.5 05/31/2022 1542   MCHC 34.5 05/31/2022 1542   RDW 11.8 05/31/2022 1542    Hgb A1C No results found for: HGBA1C         Assessment & Plan:   Preventative Health Maintenance:  She declines flu shot today She declines tetanus vaccine Encouraged her to get a COVID-vaccine Discussed Shingrix vaccine, she will check coverage with her insurance company and schedule a nurse visit if she would like to have  this done She no longer needs Pap smears She declines mammogram and bone density at this time She declines any type of colon cancer screening at this time Encouraged her to consume a balanced diet and exercise regimen Advised her to see an eye doctor and dentist annually   RTC in 6 months, follow-up chronic conditions Angeline Laura, NP

## 2024-09-18 ENCOUNTER — Other Ambulatory Visit: Payer: Self-pay | Admitting: Internal Medicine

## 2024-09-19 NOTE — Telephone Encounter (Signed)
 Requested Prescriptions  Pending Prescriptions Disp Refills   buPROPion  (WELLBUTRIN  XL) 150 MG 24 hr tablet [Pharmacy Med Name: BUPROPION  HCL XL 150 MG TABLET] 90 tablet 0    Sig: TAKE 1 TABLET BY MOUTH EVERY DAY     Psychiatry: Antidepressants - bupropion  Failed - 09/19/2024  4:01 PM      Failed - Cr in normal range and within 360 days    Creat  Date Value Ref Range Status  06/16/2023 0.69 0.50 - 1.05 mg/dL Final         Failed - AST in normal range and within 360 days    AST  Date Value Ref Range Status  06/16/2023 12 10 - 35 U/L Final         Failed - ALT in normal range and within 360 days    ALT  Date Value Ref Range Status  06/16/2023 10 6 - 29 U/L Final         Failed - Valid encounter within last 6 months    Recent Outpatient Visits           2 months ago Seasonal allergic rhinitis due to other allergic trigger   [facility] [place], Angeline ORN, NP   7 months ago Dysuria   [facility] [place], [place] W, NP              Passed - Last BP in normal range    BP Readings from Last 1 Encounters:  07/02/24 132/70

## 2024-10-16 ENCOUNTER — Ambulatory Visit (INDEPENDENT_AMBULATORY_CARE_PROVIDER_SITE_OTHER): Payer: Self-pay | Admitting: Internal Medicine

## 2024-10-16 ENCOUNTER — Encounter: Payer: Self-pay | Admitting: Internal Medicine

## 2024-10-16 VITALS — BP 130/72 | Ht 65.0 in | Wt 134.4 lb

## 2024-10-16 DIAGNOSIS — Z0000 Encounter for general adult medical examination without abnormal findings: Secondary | ICD-10-CM

## 2024-10-16 DIAGNOSIS — R739 Hyperglycemia, unspecified: Secondary | ICD-10-CM

## 2024-10-16 DIAGNOSIS — E7800 Pure hypercholesterolemia, unspecified: Secondary | ICD-10-CM

## 2024-10-16 DIAGNOSIS — Z0001 Encounter for general adult medical examination with abnormal findings: Secondary | ICD-10-CM

## 2024-10-16 MED ORDER — MECLIZINE HCL 12.5 MG PO TABS: 12.5000 mg | ORAL_TABLET | Freq: Three times a day (TID) | ORAL | 2 refills | Status: AC | PRN

## 2024-10-16 MED ORDER — BUPROPION HCL ER (XL) 150 MG PO TB24: 150.0000 mg | ORAL_TABLET | Freq: Every day | ORAL | 1 refills | Status: AC

## 2024-10-16 NOTE — Patient Instructions (Signed)
 Health Maintenance, Female Adopting a healthy lifestyle and getting preventive care are important in promoting health and wellness. Ask your health care provider about: The right schedule for you to have regular tests and exams. Things you can do on your own to prevent diseases and keep yourself healthy. What should I know about diet, weight, and exercise? Eat a healthy diet  Eat a diet that includes plenty of vegetables, fruits, low-fat dairy products, and lean protein. Do not eat a lot of foods that are high in solid fats, added sugars, or sodium. Maintain a healthy weight Body mass index (BMI) is used to identify weight problems. It estimates body fat based on height and weight. Your health care provider can help determine your BMI and help you achieve or maintain a healthy weight. Get regular exercise Get regular exercise. This is one of the most important things you can do for your health. Most adults should: Exercise for at least 150 minutes each week. The exercise should increase your heart rate and make you sweat (moderate-intensity exercise). Do strengthening exercises at least twice a week. This is in addition to the moderate-intensity exercise. Spend less time sitting. Even light physical activity can be beneficial. Watch cholesterol and blood lipids Have your blood tested for lipids and cholesterol at 64 years of age, then have this test every 5 years. Have your cholesterol levels checked more often if: Your lipid or cholesterol levels are high. You are older than 64 years of age. You are at high risk for heart disease. What should I know about cancer screening? Depending on your health history and family history, you may need to have cancer screening at various ages. This may include screening for: Breast cancer. Cervical cancer. Colorectal cancer. Skin cancer. Lung cancer. What should I know about heart disease, diabetes, and high blood pressure? Blood pressure and heart  disease High blood pressure causes heart disease and increases the risk of stroke. This is more likely to develop in people who have high blood pressure readings or are overweight. Have your blood pressure checked: Every 3-5 years if you are 32-37 years of age. Every year if you are 71 years old or older. Diabetes Have regular diabetes screenings. This checks your fasting blood sugar level. Have the screening done: Once every three years after age 24 if you are at a normal weight and have a low risk for diabetes. More often and at a younger age if you are overweight or have a high risk for diabetes. What should I know about preventing infection? Hepatitis B If you have a higher risk for hepatitis B, you should be screened for this virus. Talk with your health care provider to find out if you are at risk for hepatitis B infection. Hepatitis C Testing is recommended for: Everyone born from [birth year] through 1965. Anyone with known risk factors for hepatitis C. Sexually transmitted infections (STIs) Get screened for STIs, including gonorrhea and chlamydia, if: You are sexually active and are younger than 64 years of age. You are older than 64 years of age and your health care provider tells you that you are at risk for this type of infection. Your sexual activity has changed since you were last screened, and you are at increased risk for chlamydia or gonorrhea. Ask your health care provider if you are at risk. Ask your health care provider about whether you are at high risk for HIV. Your health care provider may recommend a prescription medicine to help prevent HIV  infection. If you choose to take medicine to prevent HIV, you should first get tested for HIV. You should then be tested every 3 months for as long as you are taking the medicine. Pregnancy If you are about to stop having your period (premenopausal) and you may become pregnant, seek counseling before you get pregnant. Take 400 to 800  micrograms (mcg) of folic acid every day if you become pregnant. Ask for birth control (contraception) if you want to prevent pregnancy. Osteoporosis and menopause Osteoporosis is a disease in which the bones lose minerals and strength with aging. This can result in bone fractures. If you are 42 years old or older, or if you are at risk for osteoporosis and fractures, ask your health care provider if you should: Be screened for bone loss. Take a calcium  or vitamin D  supplement to lower your risk of fractures. Be given hormone replacement therapy (HRT) to treat symptoms of menopause. Follow these instructions at home: Alcohol use Do not drink alcohol if: Your health care provider tells you not to drink. You are pregnant, may be pregnant, or are planning to become pregnant. If you drink alcohol: Limit how much you have to: 0-1 drink a day. Know how much alcohol is in your drink. In the [place] one drink equals one 12 oz bottle of beer (355 mL), one 5 oz glass of wine (148 mL), or one 1 oz glass of hard liquor (44 mL). Lifestyle Do not use any products that contain nicotine or tobacco. These products include cigarettes, chewing tobacco, and vaping devices, such as e-cigarettes. If you need help quitting, ask your health care provider. Do not use street drugs. Do not share needles. Ask your health care provider for help if you need support or information about quitting drugs. General instructions Schedule regular health, dental, and eye exams. Stay current with your vaccines. Tell your health care provider if: You often feel depressed. You have ever been abused or do not feel safe at home. This information is not intended to replace advice given to you by your health care provider. Make sure you discuss any questions you have with your health care provider. Document Revised: 03/07/2024 Document Reviewed: 01/19/2021 Elsevier Patient Education  2025 [employer].

## 2024-10-17 ENCOUNTER — Ambulatory Visit: Payer: Self-pay | Admitting: Internal Medicine

## 2024-10-17 ENCOUNTER — Other Ambulatory Visit: Payer: Self-pay

## 2024-10-17 LAB — COMPREHENSIVE METABOLIC PANEL WITH GFR
AG Ratio: 2.2 (calc) (ref 1.0–2.5)
ALT: 13 U/L (ref 6–29)
AST: 16 U/L (ref 10–35)
Albumin: 4.9 g/dL (ref 3.6–5.1)
Alkaline phosphatase (APISO): 87 U/L (ref 37–153)
BUN: 12 mg/dL (ref 7–25)
CO2: 29 mmol/L (ref 20–32)
Calcium: 10.1 mg/dL (ref 8.6–10.4)
Chloride: 100 mmol/L (ref 98–110)
Creat: 0.69 mg/dL (ref 0.50–1.05)
Globulin: 2.2 g/dL (ref 1.9–3.7)
Glucose, Bld: 114 mg/dL (ref 65–139)
Potassium: 4.3 mmol/L (ref 3.5–5.3)
Sodium: 138 mmol/L (ref 135–146)
Total Bilirubin: 0.4 mg/dL (ref 0.2–1.2)
Total Protein: 7.1 g/dL (ref 6.1–8.1)
eGFR: 97 mL/min/{1.73_m2}

## 2024-10-17 LAB — HEMOGLOBIN A1C
Hgb A1c MFr Bld: 5.5 %
Mean Plasma Glucose: 111 mg/dL
eAG (mmol/L): 6.2 mmol/L

## 2024-10-17 LAB — CBC
HCT: 42.4 % (ref 35.9–46.0)
Hemoglobin: 13.8 g/dL (ref 11.7–15.5)
MCH: 29.4 pg (ref 27.0–33.0)
MCHC: 32.5 g/dL (ref 31.6–35.4)
MCV: 90.4 fL (ref 81.4–101.7)
MPV: 10.3 fL (ref 7.5–12.5)
Platelets: 277 10*3/uL (ref 140–400)
RBC: 4.69 Million/uL (ref 3.80–5.10)
RDW: 12.8 % (ref 11.0–15.0)
WBC: 7.4 10*3/uL (ref 3.8–10.8)

## 2024-10-17 LAB — LIPID PANEL
Cholesterol: 247 mg/dL — ABNORMAL HIGH
HDL: 87 mg/dL
LDL Cholesterol (Calc): 138 mg/dL — ABNORMAL HIGH
Non-HDL Cholesterol (Calc): 160 mg/dL — ABNORMAL HIGH
Total CHOL/HDL Ratio: 2.8 (calc)
Triglycerides: 108 mg/dL

## 2024-10-17 MED ORDER — ATORVASTATIN CALCIUM 10 MG PO TABS: 10.0000 mg | ORAL_TABLET | Freq: Every day | ORAL | 1 refills | Status: AC

## 2025-04-15 ENCOUNTER — Ambulatory Visit: Payer: Self-pay | Admitting: Internal Medicine
